# Patient Record
Sex: Female | Born: 2000 | Race: White | Hispanic: No | Smoking: Former smoker
Health system: Southern US, Community
[De-identification: ages and names within clinical notes are randomized; demographics above are authoritative.]

## PROBLEM LIST (undated history)

## (undated) DIAGNOSIS — B009 Herpesviral infection, unspecified: Secondary | ICD-10-CM

## (undated) DIAGNOSIS — K219 Gastro-esophageal reflux disease without esophagitis: Secondary | ICD-10-CM

## (undated) DIAGNOSIS — F32A Depression, unspecified: Secondary | ICD-10-CM

## (undated) DIAGNOSIS — E039 Hypothyroidism, unspecified: Secondary | ICD-10-CM

## (undated) HISTORY — DX: Herpesviral infection, unspecified: B00.9

## (undated) HISTORY — PX: WISDOM TOOTH EXTRACTION: SHX21

---
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
## 2019-01-14 ENCOUNTER — Other Ambulatory Visit: Payer: Self-pay

## 2019-11-04 ENCOUNTER — Other Ambulatory Visit: Payer: Self-pay | Admitting: Otolaryngology

## 2019-11-14 ENCOUNTER — Encounter (HOSPITAL_BASED_OUTPATIENT_CLINIC_OR_DEPARTMENT_OTHER): Payer: Self-pay | Admitting: Otolaryngology

## 2019-11-14 ENCOUNTER — Other Ambulatory Visit: Payer: Self-pay

## 2019-11-20 ENCOUNTER — Other Ambulatory Visit (HOSPITAL_COMMUNITY)
Admission: RE | Admit: 2019-11-20 | Discharge: 2019-11-20 | Disposition: A | Discharge status: Home / Self Care | Payer: 59 | Source: Ambulatory Visit | Attending: Otolaryngology | Admitting: Otolaryngology

## 2019-11-20 DIAGNOSIS — Z01812 Encounter for preprocedural laboratory examination: Secondary | ICD-10-CM | POA: Diagnosis not present

## 2019-11-20 DIAGNOSIS — Z20822 Contact with and (suspected) exposure to covid-19: Secondary | ICD-10-CM | POA: Insufficient documentation

## 2019-11-20 LAB — SARS CORONAVIRUS 2 (TAT 6-24 HRS): SARS Coronavirus 2: NEGATIVE

## 2019-11-24 ENCOUNTER — Ambulatory Visit (HOSPITAL_BASED_OUTPATIENT_CLINIC_OR_DEPARTMENT_OTHER): Payer: 59 | Admitting: Anesthesiology

## 2019-11-24 ENCOUNTER — Encounter (HOSPITAL_BASED_OUTPATIENT_CLINIC_OR_DEPARTMENT_OTHER)
Admission: RE | Disposition: A | Discharge status: Home / Self Care | Payer: Self-pay | Source: Home / Self Care | Attending: Otolaryngology

## 2019-11-24 ENCOUNTER — Encounter (HOSPITAL_BASED_OUTPATIENT_CLINIC_OR_DEPARTMENT_OTHER): Payer: Self-pay | Admitting: Otolaryngology

## 2019-11-24 ENCOUNTER — Other Ambulatory Visit: Payer: Self-pay

## 2019-11-24 ENCOUNTER — Ambulatory Visit (HOSPITAL_BASED_OUTPATIENT_CLINIC_OR_DEPARTMENT_OTHER)
Admission: RE | Admit: 2019-11-24 | Discharge: 2019-11-24 | Disposition: A | Discharge status: Home / Self Care | Payer: 59 | Attending: Otolaryngology | Admitting: Otolaryngology

## 2019-11-24 DIAGNOSIS — J3501 Chronic tonsillitis: Secondary | ICD-10-CM | POA: Diagnosis present

## 2019-11-24 DIAGNOSIS — F17290 Nicotine dependence, other tobacco product, uncomplicated: Secondary | ICD-10-CM | POA: Insufficient documentation

## 2019-11-24 HISTORY — DX: Hypothyroidism, unspecified: E03.9

## 2019-11-24 HISTORY — PX: TONSILLECTOMY: SHX5217

## 2019-11-24 HISTORY — DX: Gastro-esophageal reflux disease without esophagitis: K21.9

## 2019-11-24 HISTORY — DX: Depression, unspecified: F32.A

## 2019-11-24 LAB — POCT PREGNANCY, URINE: Preg Test, Ur: NEGATIVE

## 2019-11-24 SURGERY — TONSILLECTOMY
Anesthesia: General | Site: Throat | Laterality: Bilateral

## 2019-11-24 MED ORDER — LIDOCAINE 2% (20 MG/ML) 5 ML SYRINGE
INTRAMUSCULAR | Status: AC
  Filled 2019-11-24: qty 5

## 2019-11-24 MED ORDER — BACITRACIN ZINC 500 UNIT/GM EX OINT
TOPICAL_OINTMENT | CUTANEOUS | Status: AC
  Filled 2019-11-24: qty 0.9

## 2019-11-24 MED ORDER — FENTANYL CITRATE (PF) 100 MCG/2ML IJ SOLN
INTRAMUSCULAR | Status: AC
Start: 2019-11-24 — End: ?
  Filled 2019-11-24: qty 2

## 2019-11-24 MED ORDER — OXYCODONE HCL 5 MG PO TABS
5.0000 mg | ORAL_TABLET | Freq: Once | ORAL | Status: AC
  Administered 2019-11-24: 5 mg via ORAL

## 2019-11-24 MED ORDER — ONDANSETRON HCL 4 MG/2ML IJ SOLN
INTRAMUSCULAR | Status: AC
  Filled 2019-11-24: qty 2

## 2019-11-24 MED ORDER — SUGAMMADEX SODIUM 200 MG/2ML IV SOLN
INTRAVENOUS | Status: DC | PRN
  Administered 2019-11-24: 200 mg via INTRAVENOUS

## 2019-11-24 MED ORDER — ACETAMINOPHEN 500 MG PO TABS
1000.0000 mg | ORAL_TABLET | Freq: Once | ORAL | Status: AC
  Administered 2019-11-24: 1000 mg via ORAL

## 2019-11-24 MED ORDER — OXYMETAZOLINE HCL 0.05 % NA SOLN
NASAL | Status: DC | PRN
  Administered 2019-11-24: 1 via TOPICAL

## 2019-11-24 MED ORDER — ROCURONIUM BROMIDE 100 MG/10ML IV SOLN
INTRAVENOUS | Status: DC | PRN
  Administered 2019-11-24: 40 mg via INTRAVENOUS

## 2019-11-24 MED ORDER — MIDAZOLAM HCL 5 MG/5ML IJ SOLN
INTRAMUSCULAR | Status: DC | PRN
  Administered 2019-11-24: 2 mg via INTRAVENOUS

## 2019-11-24 MED ORDER — DEXAMETHASONE SODIUM PHOSPHATE 10 MG/ML IJ SOLN
INTRAMUSCULAR | Status: AC
  Filled 2019-11-24: qty 1

## 2019-11-24 MED ORDER — AZITHROMYCIN 200 MG/5ML PO SUSR: 500.0000 mg | Freq: Every day | ORAL | 0 refills | Status: AC

## 2019-11-24 MED ORDER — SODIUM CHLORIDE 0.9 % IR SOLN
Status: DC | PRN
  Administered 2019-11-24: 300 mL

## 2019-11-24 MED ORDER — OXYCODONE HCL 5 MG PO TABS
ORAL_TABLET | ORAL | Status: AC
  Filled 2019-11-24: qty 1

## 2019-11-24 MED ORDER — MIDAZOLAM HCL 2 MG/2ML IJ SOLN
INTRAMUSCULAR | Status: AC
  Filled 2019-11-24: qty 2

## 2019-11-24 MED ORDER — PROPOFOL 10 MG/ML IV BOLUS
INTRAVENOUS | Status: AC
  Filled 2019-11-24: qty 20

## 2019-11-24 MED ORDER — AMISULPRIDE (ANTIEMETIC) 5 MG/2ML IV SOLN: 10.0000 mg | Freq: Once | INTRAVENOUS | Status: DC | PRN

## 2019-11-24 MED ORDER — PROPOFOL 10 MG/ML IV BOLUS
INTRAVENOUS | Status: DC | PRN
  Administered 2019-11-24: 150 mg via INTRAVENOUS

## 2019-11-24 MED ORDER — FENTANYL CITRATE (PF) 100 MCG/2ML IJ SOLN
INTRAMUSCULAR | Status: AC
  Filled 2019-11-24: qty 2

## 2019-11-24 MED ORDER — LIDOCAINE HCL (CARDIAC) PF 100 MG/5ML IV SOSY
PREFILLED_SYRINGE | INTRAVENOUS | Status: DC | PRN
  Administered 2019-11-24: 60 mg via INTRAVENOUS

## 2019-11-24 MED ORDER — ROCURONIUM BROMIDE 10 MG/ML (PF) SYRINGE
PREFILLED_SYRINGE | INTRAVENOUS | Status: AC
  Filled 2019-11-24: qty 10

## 2019-11-24 MED ORDER — OXYMETAZOLINE HCL 0.05 % NA SOLN
NASAL | Status: AC
  Filled 2019-11-24: qty 30

## 2019-11-24 MED ORDER — ACETAMINOPHEN 500 MG PO TABS
ORAL_TABLET | ORAL | Status: AC
  Filled 2019-11-24: qty 2

## 2019-11-24 MED ORDER — FENTANYL CITRATE (PF) 100 MCG/2ML IJ SOLN
25.0000 ug | INTRAMUSCULAR | Status: DC | PRN
  Administered 2019-11-24: 50 ug via INTRAVENOUS

## 2019-11-24 MED ORDER — FENTANYL CITRATE (PF) 100 MCG/2ML IJ SOLN
INTRAMUSCULAR | Status: DC | PRN
  Administered 2019-11-24: 100 ug via INTRAVENOUS

## 2019-11-24 MED ORDER — ONDANSETRON HCL 4 MG/2ML IJ SOLN
INTRAMUSCULAR | Status: DC | PRN
  Administered 2019-11-24: 4 mg via INTRAVENOUS

## 2019-11-24 MED ORDER — LACTATED RINGERS IV SOLN: INTRAVENOUS | Status: DC

## 2019-11-24 MED ORDER — DEXAMETHASONE SODIUM PHOSPHATE 4 MG/ML IJ SOLN
INTRAMUSCULAR | Status: DC | PRN
  Administered 2019-11-24: 10 mg via INTRAVENOUS

## 2019-11-24 MED ORDER — OXYCODONE-ACETAMINOPHEN 5-325 MG PO TABS
1.0000 | ORAL_TABLET | ORAL | 0 refills | Status: AC | PRN
Start: 2019-11-24 — End: 2019-11-29

## 2019-11-24 SURGICAL SUPPLY — 33 items
BNDG COHESIVE 2X5 TAN STRL LF (GAUZE/BANDAGES/DRESSINGS) IMPLANT
CANISTER SUCT 1200ML W/VALVE (MISCELLANEOUS) ×4 IMPLANT
CATH ROBINSON RED A/P 10FR (CATHETERS) IMPLANT
CATH ROBINSON RED A/P 14FR (CATHETERS) ×3 IMPLANT
COAGULATOR SUCT 6 FR SWTCH (ELECTROSURGICAL)
COAGULATOR SUCT SWTCH 10FR 6 (ELECTROSURGICAL) IMPLANT
COVER BACK TABLE 60X90IN (DRAPES) ×4 IMPLANT
COVER MAYO STAND STRL (DRAPES) ×4 IMPLANT
COVER WAND RF STERILE (DRAPES) IMPLANT
ELECT REM PT RETURN 9FT ADLT (ELECTROSURGICAL) ×4
ELECT REM PT RETURN 9FT PED (ELECTROSURGICAL)
ELECTRODE REM PT RETRN 9FT PED (ELECTROSURGICAL) IMPLANT
ELECTRODE REM PT RTRN 9FT ADLT (ELECTROSURGICAL) ×1 IMPLANT
GAUZE SPONGE 4X4 12PLY STRL LF (GAUZE/BANDAGES/DRESSINGS) ×4 IMPLANT
GLOVE BIO SURGEON STRL SZ7 (GLOVE) ×3 IMPLANT
GLOVE BIO SURGEON STRL SZ7.5 (GLOVE) ×4 IMPLANT
GLOVE ECLIPSE 6.5 STRL STRAW (GLOVE) ×3 IMPLANT
GOWN STRL REUS W/ TWL LRG LVL3 (GOWN DISPOSABLE) ×4 IMPLANT
GOWN STRL REUS W/TWL LRG LVL3 (GOWN DISPOSABLE) ×8
IV NS 500ML (IV SOLUTION) ×4
IV NS 500ML BAXH (IV SOLUTION) ×2 IMPLANT
MARKER SKIN DUAL TIP RULER LAB (MISCELLANEOUS) IMPLANT
NS IRRIG 1000ML POUR BTL (IV SOLUTION) ×4 IMPLANT
SHEET MEDIUM DRAPE 40X70 STRL (DRAPES) ×4 IMPLANT
SOLUTION BUTLER CLEAR DIP (MISCELLANEOUS) ×4 IMPLANT
SPONGE TONSIL TAPE 1.25 RFD (DISPOSABLE) ×4 IMPLANT
SYR BULB EAR ULCER 3OZ GRN STR (SYRINGE) IMPLANT
TOWEL GREEN STERILE FF (TOWEL DISPOSABLE) ×4 IMPLANT
TUBE CONNECTING 20'X1/4 (TUBING) ×1
TUBE CONNECTING 20X1/4 (TUBING) ×3 IMPLANT
TUBE SALEM SUMP 12R W/ARV (TUBING) IMPLANT
TUBE SALEM SUMP 16 FR W/ARV (TUBING) ×3 IMPLANT
WAND COBLATOR 70 EVAC XTRA (SURGICAL WAND) ×4 IMPLANT

## 2019-11-24 NOTE — Anesthesia Procedure Notes (Signed)
Procedure Name: Intubation Date/Time: 11/24/2019 7:40 AM Performed by: Thornell Mule, CRNA Pre-anesthesia Checklist: Patient identified, Emergency Drugs available, Suction available and Patient being monitored Patient Re-evaluated:Patient Re-evaluated prior to induction Oxygen Delivery Method: Circle system utilized Preoxygenation: Pre-oxygenation with 100% oxygen Induction Type: IV induction Ventilation: Mask ventilation without difficulty Laryngoscope Size: Miller and 3 Grade View: Grade I Tube type: Oral Tube size: 7.0 mm Number of attempts: 1 Airway Equipment and Method: Stylet and Oral airway Placement Confirmation: ETT inserted through vocal cords under direct vision,  positive ETCO2 and breath sounds checked- equal and bilateral Secured at: 21 cm Tube secured with: Tape Dental Injury: Teeth and Oropharynx as per pre-operative assessment

## 2019-11-24 NOTE — Discharge Instructions (Addendum)
[facility] Postoperative Instructions for Tonsillectomy  Activity Restrict activity at home for the first two days, resting as much as possible. Light indoor activity is best. You may usually return to school or work within a week but void strenuous activity and sports for two weeks. Sleep with your head elevated on 2-3 pillows for 3-4 days to help decrease swelling. Diet Due to tissue swelling and throat discomfort, you may have little desire to drink for several days. However fluids are very important to prevent dehydration. You will find that non-acidic juices, soups, popsicles, Jell-O, custard, puddings, and any soft or mashed foods taken in small quantities can be swallowed fairly easily. Try to increase your fluid and food intake as the discomfort subsides. It is recommended that a child receive 1-1/2 quarts of fluid in a 24-hour period. Adult require twice this amount.  Discomfort Your sore throat may be relieved by applying an ice collar to your neck and/or by taking Tylenol. You may experience an earache, which is due to referred pain from the throat. Referred ear pain is commonly felt at night when trying to rest.   Post Anesthesia Home Care Instructions  Activity: Get plenty of rest for the remainder of the day. A responsible individual must stay with you for 24 hours following the procedure.  For the next 24 hours, DO NOT: -Drive a car -Advertising copywriter -Drink alcoholic beverages -Take any medication unless instructed by your physician -Make any legal decisions or sign important papers.  Meals: Start with liquid foods such as gelatin or soup. Progress to regular foods as tolerated. Avoid greasy, spicy, heavy foods. If nausea and/or vomiting occur, drink only clear liquids until the nausea and/or vomiting subsides. Call your physician if vomiting continues.  Special Instructions/Symptoms: Your throat may feel dry or sore from the anesthesia or the breathing tube  placed in your throat during surgery. If this causes discomfort, gargle with warm salt water. The discomfort should disappear within 24 hours.  If you had a scopolamine patch placed behind your ear for the management of post- operative nausea and/or vomiting:  1. The medication in the patch is effective for 72 hours, after which it should be removed.  Wrap patch in a tissue and discard in the trash. Wash hands thoroughly with soap and water. 2. You may remove the patch earlier than 72 hours if you experience unpleasant side effects which may include dry mouth, dizziness or visual disturbances. 3. Avoid touching the patch. Wash your hands with soap and water after contact with the patch.    Do not take Tylenol until 1:30 pm.  Bleeding                        Although rare, there is risk of having some bleeding during the first 2 weeks after having a T&A. This usually happens between days 7-10 postoperatively. If you or your child should have any bleeding, try to remain calm. We recommend sitting up quietly in a chair and gently spitting out the blood into a bowl. For adults, gargling gently with ice water may help. If the bleeding does not stop after a short time (5 minutes), is more than 1 teaspoonful, or if you become worried, please call our office at [phone] or go directly to the nearest hospital emergency room. Do not eat or drink anything prior to going to the hospital as you may need to be taken to the operating room  in order to control the bleeding. GENERAL CONSIDERATIONS 1. Brush your teeth regularly. Avoid mouthwashes and gargles for three weeks. You may gargle gently with warm salt-water as necessary or spray with Chloraseptic. You may make salt-water by placing 2 teaspoons of table salt into a quart of fresh water. Warm the salt-water in a microwave to a luke warm temperature.  2. Avoid exposure to colds and upper respiratory infections if possible.  3. If you look into a mirror or  into your child's mouth, you will see white-gray patches in the back of the throat. This is normal after having a T&A and is like a scab that forms on the skin after an abrasion. It will disappear once the back of the throat heals completely. However, it may cause a noticeable odor; this too will disappear with time. Again, warm salt-water gargles may be used to help keep the throat clean and promote healing.  4. You may notice a temporary change in voice quality, such as a higher pitched voice or a nasal sound, until healing is complete. This may last for 1-2 weeks and should resolve.  5. Do not take or give you child any medications that we have not prescribed or recommended.  6. Snoring may occur, especially at night, for the first week after a T&A. It is due to swelling of the soft palate and will usually resolve.  Please call our office at [phone] if you have any questions.

## 2019-11-24 NOTE — Anesthesia Preprocedure Evaluation (Addendum)
Anesthesia Evaluation  Patient identified by MRN, date of birth, ID band Patient awake    Reviewed: Allergy & Precautions, NPO status , Patient's Chart, lab work & pertinent test results  Airway Mallampati: II  TM Distance: >3 FB Neck ROM: Full    Dental  (+) Dental Advisory Given   Pulmonary Current Smoker,    breath sounds clear to auscultation       Cardiovascular negative cardio ROS   Rhythm:Regular Rate:Normal     Neuro/Psych negative neurological ROS     GI/Hepatic Neg liver ROS, GERD  ,  Endo/Other  Hypothyroidism   Renal/GU negative Renal ROS     Musculoskeletal   Abdominal   Peds  Hematology negative hematology ROS (+)   Anesthesia Other Findings   Reproductive/Obstetrics                             Anesthesia Physical Anesthesia Plan  ASA: II  Anesthesia Plan: General   Post-op Pain Management:    Induction: Intravenous  PONV Risk Score and Plan: 2 and Dexamethasone, Ondansetron and Treatment may vary due to age or medical condition  Airway Management Planned: Oral ETT  Additional Equipment:   Intra-op Plan:   Post-operative Plan: Extubation in OR  Informed Consent: I have reviewed the patients History and Physical, chart, labs and discussed the procedure including the risks, benefits and alternatives for the proposed anesthesia with the patient or authorized representative who has indicated his/her understanding and acceptance.     Dental advisory given  Plan Discussed with: CRNA  Anesthesia Plan Comments:         Anesthesia Quick Evaluation

## 2019-11-24 NOTE — H&P (Signed)
CC: Chronic sore throat, enlarged tonsils, frequent tonsil stone  HPI: The patient is a 19 year old female who presents today complaining of chronic sore throat, enlarged tonsils and frequent tonsil stone accumulations.  According to the patient, she has been symptomatic for many years.  Her tonsils have been chronically enlarged.  She also has a history of recurrent tonsillitis.  At one point, she had 6 strep infections in 1 year.  She also reports occasional snoring.  She is unsure if she has any sleep apnea.  The patient previously underwent bilateral myringotomy and tube placement and adenoidectomy as a child.  She has no other ENT surgery.   The patient's review of systems (constitutional, eyes, ENT, cardiovascular, respiratory, GI, musculoskeletal, skin, neurologic, psychiatric, endocrine, hematologic, allergic) is noted in the ROS questionnaire.  It is reviewed with the patient.   Family health history: Thyroid disease, diabetes .  Major events: Adenoidectomy, wisdom teeth excision, myringotomy with tubes.  Ongoing medical problems: Reflux, nausea, headaches, migraine, anxiety, thyroid disease.  Social history: The patient is single. She vapes on occasions. She rarely drinks alcohol. He denies the use of illegal drugs.   Exam: General: Communicates without difficulty, well nourished, no acute distress. Head: Normocephalic, no evidence injury, no tenderness, facial buttresses intact without stepoff. Face/sinus: No tenderness to palpation and percussion. Facial movement is normal and symmetric. Eyes: PERRL, EOMI. No scleral icterus, conjunctivae clear. Neuro: CN II exam reveals vision grossly intact.  No nystagmus at any point of gaze. Ears: Auricles well formed without lesions.  Ear canals are intact without mass or lesion.  No erythema or edema is appreciated.  The TMs are intact without fluid. Nose: External evaluation reveals normal support and skin without lesions.  Dorsum is intact.  Anterior  rhinoscopy reveals pink mucosa over anterior aspect of inferior turbinates and intact septum.  No purulence noted. Oral:  Oral cavity and oropharynx are intact, symmetric. 3+ cryptic tonsils bilaterally. Mucosa is moist without lesions. Neck: Full range of motion without pain.  There is no significant  lymphadenopathy.  No masses palpable.  Thyroid bed within normal limits to palpation.  Parotid glands and submandibular glands equal bilaterally without mass.  Trachea is midline. Neuro:  CN 2-12 grossly intact. Gait normal.  Assessment 1.  The patient's history and physical exam findings are consistent with chronic tonsillitis, secondary to tonsillar hypertrophy. She is noted to have 3+ tonsils bilaterally.  2.  No acute infection is noted today.   Plan  1.  The physical exam findings are reviewed with the patient.  2.  Treatment options are discussed.  The options include conservative observation, medical management, or surgical intervention with tonsillectomy/possible adenoidectomy.  The risks, benefits, alternatives and details of the treatment options are reviewed.   3.  The patient is interested in proceeding with surgical intervention.  We will schedule the procedure in accordance with the family's schedule.

## 2019-11-24 NOTE — Op Note (Signed)
DATE OF PROCEDURE:  11/24/2019                              OPERATIVE REPORT  SURGEON:  Newman Pies, MD  PREOPERATIVE DIAGNOSES: 1. Tonsillar hypertrophy. 2. Chronic tonsillitis.  POSTOPERATIVE DIAGNOSES: 1. Tonsillar hypertrophy. 2. Chronic tonsillitis.  PROCEDURE PERFORMED:  Tonsillectomy.  ANESTHESIA:  General endotracheal tube anesthesia.  COMPLICATIONS:  None.  ESTIMATED BLOOD LOSS:  Minimal.  INDICATION FOR PROCEDURE:  Melissa Ayers is a 19 y.o. female with a history of chronic tonsillitis and halitosis.  According to the patient, she has been experiencing chronic throat discomfort with halitosis for several years. The patient continued to be symptomatic despite medical treatments. On examination, the patient was noted to have bilateral cryptic tonsils, with numerous tonsilloliths. Based on the above findings, the decision was made for the patient to undergo the tonsillectomy procedure. Likelihood of success in reducing symptoms was also discussed.  The risks, benefits, alternatives, and details of the procedure were discussed with the patient.  Questions were invited and answered.  Informed consent was obtained.  DESCRIPTION:  The patient was taken to the operating room and placed supine on the operating table.  General endotracheal tube anesthesia was administered by the anesthesiologist.  The patient was positioned and prepped and draped in a standard fashion for adenotonsillectomy.  A Crowe-Davis mouth gag was inserted into the oral cavity for exposure. 3+ cryptic tonsils were noted bilaterally.  No bifidity was noted.  Indirect mirror examination of the nasopharynx revealed no adenoid regrowth. The right tonsil was then grasped with a straight Allis clamp and retracted medially.  It was resected free from the underlying pharyngeal constrictor muscles with the Coblator device.  The same procedure was repeated on the left side without exception.  The surgical sites were copiously  irrigated.  The mouth gag was removed.  The care of the patient was turned over to the anesthesiologist.  The patient was awakened from anesthesia without difficulty.  The patient was extubated and transferred to the recovery room in good condition.  OPERATIVE FINDINGS:  Tonsillar hypertrophy.  SPECIMEN:  None.  FOLLOWUP CARE:  The patient will be discharged home once awake and alert.  She will be placed on azithromycin for 3 days, and percocet for postop pain control.   The patient will follow up in my office in approximately 2 weeks.  Tharon Kitch W Tallyn Holroyd 11/24/2019 8:23 AM

## 2019-11-24 NOTE — Transfer of Care (Signed)
Immediate Anesthesia Transfer of Care Note  Patient: Melissa Ayers  Procedure(s) Performed: TONSILLECTOMY (Bilateral Throat)  Patient Location: PACU  Anesthesia Type:General  Level of Consciousness: drowsy, patient cooperative and responds to stimulation  Airway & Oxygen Therapy: Patient Spontanous Breathing and Patient connected to face mask oxygen  Post-op Assessment: Report given to RN and Post -op Vital signs reviewed and stable  Post vital signs: Reviewed and stable  Last Vitals:  Vitals Value Taken Time  BP    Temp    Pulse 93 11/24/19 0827  Resp 12 11/24/19 0827  SpO2 100 % 11/24/19 0827  Vitals shown include unvalidated device data.  Last Pain:  Vitals:   11/24/19 0653  TempSrc: Oral  PainSc: 0-No pain         Complications: No complications documented.

## 2019-11-24 NOTE — Anesthesia Postprocedure Evaluation (Signed)
Anesthesia Post Note  Patient: Melissa Ayers  Procedure(s) Performed: TONSILLECTOMY (Bilateral Throat)     Patient location during evaluation: PACU Anesthesia Type: General Level of consciousness: awake and alert Pain management: pain level controlled Vital Signs Assessment: post-procedure vital signs reviewed and stable Respiratory status: spontaneous breathing, nonlabored ventilation, respiratory function stable and patient connected to nasal cannula oxygen Cardiovascular status: blood pressure returned to baseline and stable Postop Assessment: no apparent nausea or vomiting Anesthetic complications: no   No complications documented.  Last Vitals:  Vitals:   11/24/19 0834 11/24/19 0845  BP: 127/78 124/82  Pulse: 82 84  Resp: 13 12  Temp:    SpO2: 100% 98%    Last Pain:  Vitals:   11/24/19 0845  TempSrc:   PainSc: 5                  Kennieth Rad

## 2019-11-25 ENCOUNTER — Encounter (HOSPITAL_BASED_OUTPATIENT_CLINIC_OR_DEPARTMENT_OTHER): Payer: Self-pay | Admitting: Otolaryngology

## 2020-09-14 ENCOUNTER — Other Ambulatory Visit: Payer: Self-pay

## 2020-09-14 ENCOUNTER — Inpatient Hospital Stay (HOSPITAL_COMMUNITY)
Admission: AD | Admit: 2020-09-14 | Discharge: 2020-09-14 | Disposition: A | Discharge status: Home / Self Care | Payer: 59 | Attending: Obstetrics & Gynecology | Admitting: Obstetrics & Gynecology

## 2020-09-14 ENCOUNTER — Inpatient Hospital Stay (HOSPITAL_COMMUNITY): Payer: 59

## 2020-09-14 ENCOUNTER — Encounter (HOSPITAL_COMMUNITY): Payer: Self-pay | Admitting: Obstetrics & Gynecology

## 2020-09-14 DIAGNOSIS — Z3A11 11 weeks gestation of pregnancy: Secondary | ICD-10-CM | POA: Insufficient documentation

## 2020-09-14 DIAGNOSIS — O0289 Other abnormal products of conception: Secondary | ICD-10-CM | POA: Insufficient documentation

## 2020-09-14 DIAGNOSIS — M545 Low back pain, unspecified: Secondary | ICD-10-CM | POA: Diagnosis not present

## 2020-09-14 DIAGNOSIS — O26891 Other specified pregnancy related conditions, first trimester: Secondary | ICD-10-CM | POA: Diagnosis present

## 2020-09-14 DIAGNOSIS — Z87891 Personal history of nicotine dependence: Secondary | ICD-10-CM | POA: Diagnosis not present

## 2020-09-14 DIAGNOSIS — O26851 Spotting complicating pregnancy, first trimester: Secondary | ICD-10-CM | POA: Diagnosis not present

## 2020-09-14 LAB — WET PREP, GENITAL
Clue Cells Wet Prep HPF POC: NONE SEEN
Sperm: NONE SEEN
Trich, Wet Prep: NONE SEEN

## 2020-09-14 LAB — CBC
HCT: 41.4 % (ref 36.0–46.0)
Hemoglobin: 13.8 g/dL (ref 12.0–15.0)
MCH: 30.3 pg (ref 26.0–34.0)
MCHC: 33.3 g/dL (ref 30.0–36.0)
MCV: 91 fL (ref 80.0–100.0)
Platelets: 272 10*3/uL (ref 150–400)
RBC: 4.55 MIL/uL (ref 3.87–5.11)
RDW: 12.2 % (ref 11.5–15.5)
WBC: 9.5 10*3/uL (ref 4.0–10.5)
nRBC: 0 % (ref 0.0–0.2)

## 2020-09-14 LAB — URINALYSIS, ROUTINE W REFLEX MICROSCOPIC
Bilirubin Urine: NEGATIVE
Glucose, UA: NEGATIVE mg/dL
Hgb urine dipstick: NEGATIVE
Ketones, ur: 5 mg/dL — AB
Leukocytes,Ua: NEGATIVE
Nitrite: NEGATIVE
Protein, ur: NEGATIVE mg/dL
Specific Gravity, Urine: 1.014 (ref 1.005–1.030)
pH: 6 (ref 5.0–8.0)

## 2020-09-14 LAB — HCG, QUANTITATIVE, PREGNANCY: hCG, Beta Chain, Quant, S: 31487 m[IU]/mL — ABNORMAL HIGH (ref <5)

## 2020-09-14 LAB — POCT PREGNANCY, URINE: Preg Test, Ur: POSITIVE — AB

## 2020-09-14 LAB — ABO/RH: ABO/RH(D): O POS

## 2020-09-14 MED ORDER — MISOPROSTOL 200 MCG PO TABS: ORAL_TABLET | ORAL | 0 refills | Status: DC

## 2020-09-14 MED ORDER — IBUPROFEN 600 MG PO TABS: 600.0000 mg | ORAL_TABLET | Freq: Four times a day (QID) | ORAL | 0 refills | Status: DC | PRN

## 2020-09-14 MED ORDER — HYDROCODONE-ACETAMINOPHEN 5-325 MG PO TABS: 2.0000 | ORAL_TABLET | ORAL | 0 refills | Status: AC | PRN

## 2020-09-14 NOTE — MAU Note (Signed)
...  Melissa Ayers is a 20 y.o. at approximately [redacted]w[redacted]d here in MAU reporting: Vaginal spotting that began after intercourse yesterday morning. Patient states her spotting has increased some today and pink/light red tinged when she wipes. Denies abdominal pain but endorses lower back pain that she rates a 4/10. Denies any abnormal discharge, states her discharge prior to the spotting was clear/white and thin.  LMP:  06/25/2020 Lab orders placed from triage:  POCT Pregnancy Urine and UA

## 2020-09-14 NOTE — MAU Provider Note (Signed)
History     CSN: [id number]  Arrival date and time: 09/14/20 1225  Event Date/Time  First Provider Initiated Contact with Patient 09/14/20 1553      Chief Complaint  Patient presents with   Back Pain   Vaginal Bleeding   HPI Melissa Ayers is a 20 y.o. G1P0 at 3513w5d by certain LMP. She presents to MAU with chief complaint of low back pain and vaginal spotting. These are new problems. Patient's report of onset varied from one day (as reported to triage RN) to two weeks ago (as reported to CNM) during her evaluation in MAU. Spotting is very light and began after intercourse. She denies heavy vaginal bleeding. Back pain is generalized to her lower back. Pain score is 4/10. She has not taken medication or tried other treatments for this complaint. She denies abdominal pain, abdominal tenderness, weakness, fever, syncope.  OB History     Gravida  1   Para      Term      Preterm      AB      Living         SAB      IAB      Ectopic      Multiple      Live Births              Past Medical History:  Diagnosis Date   Depression    GERD (gastroesophageal reflux disease)    Hypothyroidism     Past Surgical History:  Procedure Laterality Date   TONSILLECTOMY Bilateral 11/24/2019   Procedure: TONSILLECTOMY;  Surgeon: Newman Pieseoh, Su, MD;  Location: [facility];  Service: ENT;  Laterality: Bilateral;   WISDOM TOOTH EXTRACTION      History reviewed. No pertinent family history.  Social History   Tobacco Use   Smoking status: Former    Packs/day: 0.25    Types: Cigarettes   Smokeless tobacco: Never  Vaping Use   Vaping Use: Former  Substance Use Topics   Alcohol use: Never   Drug use: Not Currently    Types: Marijuana    Comment: patient states three weeks ago as of 09/14/2020    Allergies:  Allergies  Allergen Reactions   Penicillins Rash    Medications Prior to Admission  Medication Sig Dispense Refill Last Dose   levothyroxine  (SYNTHROID) 25 MCG tablet Take 25 mcg by mouth daily before breakfast.   09/14/2020   valACYclovir (VALTREX) 500 MG tablet Take 500 mg by mouth 2 (two) times daily.   09/13/2020    Review of Systems  Genitourinary:  Positive for vaginal bleeding.  Musculoskeletal:  Positive for back pain.  All other systems reviewed and are negative. Physical Exam   Blood pressure 135/73, pulse 92, temperature 98.3 F (36.8 C), temperature source Oral, resp. rate 16, height 5\' 6"  (1.676 m), weight 82.1 kg, last menstrual period 06/25/2020, SpO2 100 %.  Physical Exam Vitals and nursing note reviewed. Exam conducted with a chaperone present.  Constitutional:      Appearance: Normal appearance.  Cardiovascular:     Rate and Rhythm: Normal rate and regular rhythm.     Pulses: Normal pulses.     Heart sounds: Normal heart sounds.  Pulmonary:     Effort: Pulmonary effort is normal.     Breath sounds: Normal breath sounds.  Abdominal:     General: Abdomen is flat.  Skin:    Capillary Refill: Capillary refill takes less than 2  seconds.  Neurological:     Mental Status: She is alert and oriented to person, place, and time.  Psychiatric:        Mood and Affect: Mood normal.        Behavior: Behavior normal.        Thought Content: Thought content normal.        Judgment: Judgment normal.    MAU Course  Procedures  Patient Vitals for the past 24 hrs:  BP Temp Temp src Pulse Resp SpO2 Height Weight  09/14/20 1619 121/70 -- -- 91 17 -- -- --  09/14/20 1304 135/73 -- -- 92 -- -- -- --  09/14/20 1249 129/70 98.3 F (36.8 C) Oral 92 16 100 % 5\' 6"  (1.676 m) 82.1 kg   Orders Placed This Encounter  Procedures   Wet prep, genital   OB LESS THAN 14 WEEKS WITH OB TRANSVAGINAL   Urinalysis, Routine w reflex microscopic Urine, Clean Catch   CBC   hCG, quantitative, pregnancy   Nursing communication   Pregnancy, urine POC   ABO/Rh   Discharge patient   Results for orders placed or performed during  the hospital encounter of 09/14/20 (from the past 24 hour(s))  Pregnancy, urine POC     Status: Abnormal   Collection Time: 09/14/20 12:45 PM  Result Value Ref Range   Preg Test, Ur POSITIVE (A) NEGATIVE  Urinalysis, Routine w reflex microscopic Urine, Clean Catch     Status: Abnormal   Collection Time: 09/14/20 12:47 PM  Result Value Ref Range   Color, Urine YELLOW YELLOW   APPearance CLEAR CLEAR   Specific Gravity, Urine 1.014 1.005 - 1.030   pH 6.0 5.0 - 8.0   Glucose, UA NEGATIVE NEGATIVE mg/dL   Hgb urine dipstick NEGATIVE NEGATIVE   Bilirubin Urine NEGATIVE NEGATIVE   Ketones, ur 5 (A) NEGATIVE mg/dL   Protein, ur NEGATIVE NEGATIVE mg/dL   Nitrite NEGATIVE NEGATIVE   Leukocytes,Ua NEGATIVE NEGATIVE  CBC     Status: None   Collection Time: 09/14/20  1:27 PM  Result Value Ref Range   WBC 9.5 4.0 - 10.5 K/uL   RBC 4.55 3.87 - 5.11 MIL/uL   Hemoglobin 13.8 12.0 - 15.0 g/dL   HCT 09/16/20 07.1 - 21.9 %   MCV 91.0 80.0 - 100.0 fL   MCH 30.3 26.0 - 34.0 pg   MCHC 33.3 30.0 - 36.0 g/dL   RDW 75.8 83.2 - 54.9 %   Platelets 272 150 - 400 K/uL   nRBC 0.0 0.0 - 0.2 %  hCG, quantitative, pregnancy     Status: Abnormal   Collection Time: 09/14/20  1:27 PM  Result Value Ref Range   hCG, Beta Chain, Quant, S 31,487 (H) <5 mIU/mL  Wet prep, genital     Status: Abnormal   Collection Time: 09/14/20  1:30 PM   Specimen: Vaginal  Result Value Ref Range   Yeast Wet Prep HPF POC PRESENT (A) NONE SEEN   Trich, Wet Prep NONE SEEN NONE SEEN   Clue Cells Wet Prep HPF POC NONE SEEN NONE SEEN   WBC, Wet Prep HPF POC FEW (A) NONE SEEN   Sperm NONE SEEN   ABO/Rh     Status: None   Collection Time: 09/14/20  1:32 PM  Result Value Ref Range   ABO/RH(D) O POS    No rh immune globuloin      NOT A RH IMMUNE GLOBULIN CANDIDATE, PT RH POSITIVE Performed at [facility]  [facility], [address] [place], [place] [address]    US OB LESS THAN 14 WEEKS WITH OB TRANSVAGINAL  Result Date:  09/14/2020 CLINICAL DATA:  Spotting in first trimester of pregnancy, LMP 06/25/2020 EXAM: OBSTETRIC <14 WK [place] AND TRANSVAGINAL OB US TECHNIQUE: Both transabdominal and transvaginal ultrasound examinations were performed for complete evaluation of the gestation as well as the maternal uterus, adnexal regions, and pelvic cul-de-sac. Transvaginal technique was performed to assess early pregnancy. COMPARISON:  None FINDINGS: Intrauterine gestational sac: Present, single, minimally irregular; thin membrane seen within sac Yolk sac:  Not identified Embryo:  Not identified Cardiac Activity: N/A Heart Rate: N/A  bpm MSD: 28.8 mm   7 w   6 d CRL:    mm    w    d                  [place] EDC: Subchorionic hemorrhage:  None identified Maternal uterus/adnexae: RIGHT ovary normal size and morphology, 3.1 x 1.3 x 1.5 cm. LEFT ovary normal size and morphology, 3.8 x 1.8 x 2.2 cm. No free pelvic fluid or adnexal masses. IMPRESSION: Gestational sac is seen within the uterus, containing a thin membrane but no definite fetal pole or yolk sac are identified. Based on mean sac diameter of 28.8 mm, findings meet definitive criteria for failed pregnancy. This follows SRU consensus guidelines: Diagnostic Criteria for Nonviable Pregnancy Early in the First Trimester. [facility] [phone]. Electronically Signed   By: Ulyses Southward M.D.   On: 09/14/2020 15:48    Early Intrauterine Pregnancy Failure  X  Documented intrauterine pregnancy failure less than or equal to [redacted] weeks gestation  X  No serious current illness  X  Baseline Hgb greater than or equal to 10g/dl  X  Patient has easily accessible transportation to the hospital  X  Clear preference  X  Practitioner/physician deems patient reliable  X  Counseling by practitioner or physician  N/A Rho-Gam given by RN if indicated  X  Medication dispensed:  -->Cytotec 800 mcg (buccally by patient at home)    -->Ibuprofen 600 mg 1 tablet by mouth every 6 hours as needed  #30  -->Hydrocodone/acetaminophen 5/325 mg by mouth every 4 to 6 hours as needed  Meds ordered this encounter  Medications   misoprostol (CYTOTEC) 200 MCG tablet    Sig: Place four tablets in between your gums and cheeks (two tablets on each side) as instructed    Dispense:  4 tablet    Refill:  0    Order Specific Question:   Supervising Provider    Answer:   Myna Hidalgo [[id number]]   ibuprofen (ADVIL) 600 MG tablet    Sig: Take 1 tablet (600 mg total) by mouth every 6 (six) hours as needed for up to 30 doses.    Dispense:  30 tablet    Refill:  0    Order Specific Question:   Supervising Provider    Answer:   Myna Hidalgo [[id number]]   HYDROcodone-acetaminophen (NORCO/VICODIN) 5-325 MG tablet    Sig: Take 2 tablets by mouth every 4 (four) hours as needed for up to 2 days for severe pain.    Dispense:  24 tablet    Refill:  0    Order Specific Question:   Supervising Provider    Answer:   Myna Hidalgo [[id number]]   Assessment and Plan  --20 y.o. G1P0 with nonviable pregnancy --Cytotec to be administered at home per patient's stated preference --  Blood type O POS --+ yeast, patient to treat with OTC Monistat PRN after she has been seen for miscarriage followup --Extensive teaching at bedside with CNM, teachback completed --Discharge home in stable condition  F/U: --Patient scheduled for New Gyn at [facility] on 09/13 --Message sent asking to convert appointment to miscarriage followup in one week  Calvert Cantor, MSA, MSN, CNM

## 2020-09-15 LAB — GC/CHLAMYDIA PROBE AMP (~~LOC~~) NOT AT ARMC
Chlamydia: NEGATIVE
Comment: NEGATIVE
Comment: NORMAL
Neisseria Gonorrhea: NEGATIVE

## 2020-09-23 ENCOUNTER — Other Ambulatory Visit: Payer: Self-pay

## 2020-09-23 ENCOUNTER — Encounter: Payer: Self-pay | Admitting: Women's Health

## 2020-09-23 ENCOUNTER — Ambulatory Visit (INDEPENDENT_AMBULATORY_CARE_PROVIDER_SITE_OTHER): Payer: Medicaid Other | Admitting: Women's Health

## 2020-09-23 VITALS — BP 130/78 | HR 96 | Ht 66.0 in | Wt 176.0 lb

## 2020-09-23 DIAGNOSIS — O039 Complete or unspecified spontaneous abortion without complication: Secondary | ICD-10-CM

## 2020-09-23 NOTE — Progress Notes (Signed)
   GYN VISIT Patient name: Melissa Ayers MRN [id number]  Date of birth: December 31, 2000 Chief Complaint:   new gyn (Missed AB)  History of Present Illness:   Melissa Ayers is a 20 y.o. G1P0 Caucasian female being seen today for f/u missed ab.  Seen in MAU 09/14/20.  HCG 31,487, U/S no YS or embryo, GS c/w [redacted]w[redacted]d, normal ovaries bilaterally. Rx'd cytotec. Rh+. States she took cytotec same day, had bleeding/clots and increased cramping 1-2d after taking cytotec, not sure if she saw a sac, but then bleeding/cramping improved and both have almost completely gone away. Sex 1-2 d ago, wants me to do exam to make sure everything's ok. Does want to try for another pregnancy. Stopped pnv.  Patient's last menstrual period was 06/25/2020. The current method of family planning is none.  Last pap 2022 at [facility]. Results were:  neg per pt  Depression screen [facility] 2/9 09/23/2020  Decreased Interest 0  Down, Depressed, Hopeless 0  PHQ - 2 Score 0  Altered sleeping 0  Tired, decreased energy 0  Change in appetite 0  Feeling bad or failure about yourself  0  Trouble concentrating 0  Moving slowly or fidgety/restless 0  Suicidal thoughts 0  PHQ-9 Score 0     GAD 7 : Generalized Anxiety Score 09/23/2020  Nervous, Anxious, on Edge 0  Control/stop worrying 0  Worry too much - different things 0  Trouble relaxing 0  Restless 0  Easily annoyed or irritable 0     Review of Systems:   Pertinent items are noted in HPI Denies fever/chills, dizziness, headaches, visual disturbances, fatigue, shortness of breath, chest pain, abdominal pain, vomiting, abnormal vaginal discharge/itching/odor/irritation, problems with periods, bowel movements, urination, or intercourse unless otherwise stated above.  Pertinent History Reviewed:  Reviewed past medical,surgical, social, obstetrical and family history.  Reviewed problem list, medications and allergies. Physical Assessment:   Vitals:   09/23/20 1412  BP: 130/78  Pulse:  96  Weight: 176 lb (79.8 kg)  Height: 5\' 6"  (1.676 m)  Body mass index is 28.41 kg/m.       Physical Examination:   General appearance: alert, well appearing, and in no distress  Mental status: alert, oriented to person, place, and time  Skin: warm & dry   Cardiovascular: normal heart rate noted  Respiratory: normal respiratory effort, no distress  Abdomen: soft, non-tender   Pelvic: VULVA: normal appearing vulva with no masses, tenderness or lesions, VAGINA: normal appearing vagina with normal color and discharge, no lesions, CERVIX: normal appearing cervix without discharge or lesions  Extremities: no edema   Chaperone: Peggy Dones    No results found for this or any previous visit (from the past 24 hour(s)).  Assessment & Plan:  1) Recent miscarriage> sounds like completed SAB, will check hcg and follow back to pre-pregnancy levels, then ok to start trying again for another pregnancy. Resume pnv. Rh+  Meds: No orders of the defined types were placed in this encounter.   Orders Placed This Encounter  Procedures   Beta hCG quant (ref lab)    Return for get pap from [facility] please; will discuss f/u after lab results.  [facility] CNM, [facility] 09/23/2020 2:56 PM

## 2020-09-24 LAB — BETA HCG QUANT (REF LAB): hCG Quant: 126 m[IU]/mL

## 2020-09-24 NOTE — Addendum Note (Signed)
Addended by: Cheral Marker on: 09/24/2020 02:46 PM   Modules accepted: Orders

## 2020-09-28 ENCOUNTER — Telehealth: Payer: Self-pay

## 2020-09-28 NOTE — Telephone Encounter (Signed)
Called pt, used two identifiers, read pt the msg Melissa Ayers left on Mychart. Pt confirmed understanding. Asked question, but service cut out and lost call.

## 2020-09-28 NOTE — Telephone Encounter (Signed)
-----   Message from Cheral Marker, [place] sent at 09/28/2020  3:52 PM EDT ----- Hasn't read [employer]. Please call and read it to her/have her read it. Thanks

## 2020-10-05 ENCOUNTER — Encounter: Payer: PRIVATE HEALTH INSURANCE | Admitting: Adult Health

## 2020-10-25 ENCOUNTER — Emergency Department (HOSPITAL_COMMUNITY): Admission: EM | Admit: 2020-10-25 | Discharge: 2020-10-25 | Discharge status: Against Medical Advice | Payer: 59

## 2020-10-25 ENCOUNTER — Other Ambulatory Visit: Payer: Self-pay

## 2020-10-25 NOTE — ED Triage Notes (Signed)
Screener reported to triage NT that pt has left prior to triage.

## 2020-11-29 ENCOUNTER — Encounter: Payer: Self-pay | Admitting: Women's Health

## 2020-11-29 ENCOUNTER — Other Ambulatory Visit: Payer: Self-pay

## 2020-11-29 ENCOUNTER — Ambulatory Visit (INDEPENDENT_AMBULATORY_CARE_PROVIDER_SITE_OTHER): Payer: Medicaid Other | Admitting: Women's Health

## 2020-11-29 ENCOUNTER — Other Ambulatory Visit (HOSPITAL_COMMUNITY)
Admission: RE | Admit: 2020-11-29 | Discharge: 2020-11-29 | Disposition: A | Discharge status: Home / Self Care | Payer: Medicaid Other | Source: Ambulatory Visit | Attending: Women's Health | Admitting: Women's Health

## 2020-11-29 VITALS — BP 126/76 | HR 87 | Ht 66.0 in | Wt 186.6 lb

## 2020-11-29 DIAGNOSIS — Z01419 Encounter for gynecological examination (general) (routine) without abnormal findings: Secondary | ICD-10-CM | POA: Insufficient documentation

## 2020-11-29 DIAGNOSIS — N907 Vulvar cyst: Secondary | ICD-10-CM | POA: Diagnosis not present

## 2020-11-29 NOTE — Progress Notes (Signed)
WELL-WOMAN EXAMINATION Patient name: Melissa Ayers MRN [id number]  Date of birth: 15-May-2000 Chief Complaint:   Gynecologic Exam  History of Present Illness:   Melissa Ayers is a 21 y.o. G65P0010 Caucasian female being seen today for a routine well-woman exam.  Current complaints: bump Lt vulva x 1wk, no pain  PCP: Garald Balding w/ PCP, on synthroid  Patient's last menstrual period was 11/14/2020 (exact date). The current method of family planning is none, recent SAB in Sept, ok if gets pregnant again, not taking pnv consistently Last pap never, thought she . Results were: N/A. H/O abnormal pap: no Last mammogram: never. Results were: N/A. Family h/o breast cancer: no Last colonoscopy: never. Results were: N/A. Family h/o colorectal cancer: no  Depression screen [facility] 2/9 11/29/2020 09/23/2020  Decreased Interest 0 0  Down, Depressed, Hopeless 0 0  PHQ - 2 Score 0 0  Altered sleeping 0 0  Tired, decreased energy 0 0  Change in appetite 0 0  Feeling bad or failure about yourself  0 0  Trouble concentrating 0 0  Moving slowly or fidgety/restless 0 0  Suicidal thoughts 0 0  PHQ-9 Score 0 0     GAD 7 : Generalized Anxiety Score 11/29/2020 09/23/2020  Nervous, Anxious, on Edge 0 0  Control/stop worrying 0 0  Worry too much - different things 0 0  Trouble relaxing 0 0  Restless 0 0  Easily annoyed or irritable 0 0  Afraid - awful might happen 0 -  Total GAD 7 Score 0 -     Review of Systems:   Pertinent items are noted in HPI Denies any headaches, blurred vision, fatigue, shortness of breath, chest pain, abdominal pain, abnormal vaginal discharge/itching/odor/irritation, problems with periods, bowel movements, urination, or intercourse unless otherwise stated above. Pertinent History Reviewed:  Reviewed past medical,surgical, social and family history.  Reviewed problem list, medications and allergies. Physical Assessment:   Vitals:   11/29/20 1108  BP: 126/76  Pulse: 87   Weight: 186 lb 9.6 oz (84.6 kg)  Height: 5\' 6"  (1.676 m)  Body mass index is 30.12 kg/m.        Physical Examination:   General appearance - well appearing, and in no distress  Mental status - alert, oriented to person, place, and time  Psych:  She has a normal mood and affect  Skin - warm and dry, normal color, no suspicious lesions noted  Chest - effort normal, all lung fields clear to auscultation bilaterally  Heart - normal rate and regular rhythm  Neck:  midline trachea, no thyromegaly or nodules  Breasts - breasts appear normal, no suspicious masses, no skin or nipple changes or  axillary nodes  Abdomen - soft, nontender, nondistended, no masses or organomegaly  Pelvic - VULVA: soft ~2cm round, fluctuant, nontender cyst Lt labia majora-not typical Bartholin's location, co-exam w/ LHE, feels it is a vulvar glandular cyst of some sort, no need to do anything unless it starts bothering her, VAGINA: normal appearing vagina with normal color and discharge, no lesions  CERVIX: normal appearing cervix without discharge or lesions, no CMT  Thin prep pap is done w/ reflex HR HPV cotesting  UTERUS: uterus is felt to be normal size, shape, consistency and nontender   ADNEXA: No adnexal masses or tenderness noted.  Extremities:  No swelling or varicosities noted  Chaperone: Angel Neas    No results found for this or any previous visit (from the  past 24 hour(s)).  Assessment & Plan:  1) Well-Woman Exam  2) Lt vulvar glandular cyst>soft, nontender, pt to let us know if gets bigger/becomes painful,otherwise will leave it along  3) Not preventing pregnancy> take pnv daily, let us know when gets +HPT  Labs/procedures today: pap  Mammogram: @ 22yo, or sooner if problems Colonoscopy: @ 22yo, or sooner if problems  No orders of the defined types were placed in this encounter.   Meds: No orders of the defined types were placed in this encounter.   Follow-up: Return in about 1 year  (around 11/29/2021) for Physical.  Cheral Marker CNM, [facility] 11/29/2020 12:06 PM

## 2020-12-02 LAB — CYTOLOGY - PAP
Chlamydia: NEGATIVE
Comment: NEGATIVE
Comment: NORMAL
Neisseria Gonorrhea: NEGATIVE

## 2020-12-06 ENCOUNTER — Encounter: Payer: Self-pay | Admitting: Women's Health

## 2020-12-06 DIAGNOSIS — R87619 Unspecified abnormal cytological findings in specimens from cervix uteri: Secondary | ICD-10-CM | POA: Insufficient documentation

## 2020-12-14 ENCOUNTER — Encounter: Payer: Self-pay | Admitting: *Deleted

## 2020-12-14 ENCOUNTER — Telehealth: Payer: Self-pay | Admitting: *Deleted

## 2020-12-14 NOTE — Telephone Encounter (Signed)
Attempted to call patient.  No answer.

## 2020-12-14 NOTE — Telephone Encounter (Signed)
-----   Message from Cheral Marker, [place] sent at 12/13/2020 11:59 AM EST ----- Hasn't read [employer]. Please call and read it to her/have her read it. Thanks

## 2020-12-14 NOTE — Telephone Encounter (Signed)
Letter with results sent to patient via certified mail.

## 2020-12-24 ENCOUNTER — Telehealth: Payer: Self-pay | Admitting: Women's Health

## 2020-12-24 NOTE — Telephone Encounter (Signed)
Patient is calling stating that she received a letter about test results and would like for someone to explain the results to her

## 2020-12-24 NOTE — Telephone Encounter (Signed)
Spoke to patient and informed of abnormal pap.  Advised to repeat pap in 1 year. Cyst on vulva seems to be getting bigger but not painful.  Offered to make patient appt but she declined.  Will continue to monitor for now.  Advised is area becomes painful, changes color, etc, to let us know or make appt.  Pt verbalized understanding.

## 2021-02-15 ENCOUNTER — Other Ambulatory Visit: Payer: Self-pay

## 2021-02-15 ENCOUNTER — Ambulatory Visit (INDEPENDENT_AMBULATORY_CARE_PROVIDER_SITE_OTHER): Payer: Medicaid Other | Admitting: *Deleted

## 2021-02-15 VITALS — BP 127/80 | HR 84 | Ht 66.0 in | Wt 196.0 lb

## 2021-02-15 DIAGNOSIS — N926 Irregular menstruation, unspecified: Secondary | ICD-10-CM

## 2021-02-15 DIAGNOSIS — Z3201 Encounter for pregnancy test, result positive: Secondary | ICD-10-CM | POA: Diagnosis not present

## 2021-02-15 LAB — POCT URINE PREGNANCY: Preg Test, Ur: POSITIVE — AB

## 2021-02-15 NOTE — Progress Notes (Signed)
° °  NURSE VISIT- PREGNANCY CONFIRMATION   SUBJECTIVE:  Melissa Ayers is a 21 y.o. G2P0010 female at [redacted]w[redacted]d by certain LMP of Patient's last menstrual period was 01/12/2021 (exact date). Here for pregnancy confirmation.  Home pregnancy test: positive x 3   She reports no complaints.  She is taking prenatal vitamins.    OBJECTIVE:  BP 127/80 (BP Location: Left Arm, Patient Position: Sitting, Cuff Size: Normal)    Pulse 84    Ht 5\' 6"  (1.676 m)    Wt 196 lb (88.9 kg)    LMP 01/12/2021 (Exact Date)    BMI 31.64 kg/m   Appears well, in no apparent distress  Results for orders placed or performed in visit on 02/15/21 (from the past 24 hour(s))  POCT urine pregnancy   Collection Time: 02/15/21 10:23 AM  Result Value Ref Range   Preg Test, Ur Positive (A) Negative    ASSESSMENT: Positive pregnancy test, [redacted]w[redacted]d by LMP    PLAN: Schedule for dating ultrasound in 3 weeks Prenatal vitamins: continue   Nausea medicines: not currently needed   OB packet given: Yes  [redacted]w[redacted]d  02/15/2021 10:23 AM

## 2021-03-08 ENCOUNTER — Other Ambulatory Visit: Payer: Self-pay | Admitting: Obstetrics & Gynecology

## 2021-03-08 DIAGNOSIS — O3680X0 Pregnancy with inconclusive fetal viability, not applicable or unspecified: Secondary | ICD-10-CM

## 2021-03-09 ENCOUNTER — Ambulatory Visit (INDEPENDENT_AMBULATORY_CARE_PROVIDER_SITE_OTHER): Payer: Medicaid Other

## 2021-03-09 ENCOUNTER — Other Ambulatory Visit: Payer: Self-pay

## 2021-03-09 DIAGNOSIS — Z3A08 8 weeks gestation of pregnancy: Secondary | ICD-10-CM | POA: Diagnosis not present

## 2021-03-09 DIAGNOSIS — O3680X0 Pregnancy with inconclusive fetal viability, not applicable or unspecified: Secondary | ICD-10-CM | POA: Diagnosis not present

## 2021-03-09 NOTE — Progress Notes (Signed)
[place] 8 wks,single IUP with YS,FHR 164 bpm,normal left ovary,complex right corpus luteal cyst 2.7 x 1.9 x 2.2 cm,CRL 16.45 mm

## 2021-04-11 ENCOUNTER — Encounter: Payer: Self-pay | Admitting: Obstetrics & Gynecology

## 2021-04-11 ENCOUNTER — Other Ambulatory Visit: Payer: Self-pay | Admitting: Obstetrics & Gynecology

## 2021-04-11 DIAGNOSIS — Z3682 Encounter for antenatal screening for nuchal translucency: Secondary | ICD-10-CM

## 2021-04-11 DIAGNOSIS — Z3490 Encounter for supervision of normal pregnancy, unspecified, unspecified trimester: Secondary | ICD-10-CM | POA: Insufficient documentation

## 2021-04-12 ENCOUNTER — Ambulatory Visit (INDEPENDENT_AMBULATORY_CARE_PROVIDER_SITE_OTHER): Payer: Medicaid Other | Admitting: Obstetrics & Gynecology

## 2021-04-12 ENCOUNTER — Other Ambulatory Visit: Payer: Self-pay

## 2021-04-12 ENCOUNTER — Encounter: Payer: Self-pay | Admitting: Obstetrics & Gynecology

## 2021-04-12 ENCOUNTER — Ambulatory Visit: Payer: Medicaid Other | Admitting: *Deleted

## 2021-04-12 ENCOUNTER — Ambulatory Visit (INDEPENDENT_AMBULATORY_CARE_PROVIDER_SITE_OTHER): Payer: Medicaid Other

## 2021-04-12 VITALS — BP 119/66 | HR 82 | Wt 191.8 lb

## 2021-04-12 DIAGNOSIS — E039 Hypothyroidism, unspecified: Secondary | ICD-10-CM

## 2021-04-12 DIAGNOSIS — Z3480 Encounter for supervision of other normal pregnancy, unspecified trimester: Secondary | ICD-10-CM

## 2021-04-12 DIAGNOSIS — Z3682 Encounter for antenatal screening for nuchal translucency: Secondary | ICD-10-CM

## 2021-04-12 DIAGNOSIS — Z3A12 12 weeks gestation of pregnancy: Secondary | ICD-10-CM

## 2021-04-12 LAB — POCT URINALYSIS DIPSTICK OB
Blood, UA: NEGATIVE
Glucose, UA: NEGATIVE
Ketones, UA: NEGATIVE
Leukocytes, UA: NEGATIVE
Nitrite, UA: NEGATIVE
POC,PROTEIN,UA: NEGATIVE

## 2021-04-12 MED ORDER — ASPIRIN EC 81 MG PO TBEC: 81.0000 mg | DELAYED_RELEASE_TABLET | Freq: Every day | ORAL | 4 refills | Status: AC

## 2021-04-12 MED ORDER — BLOOD PRESSURE MONITOR MISC
0 refills | Status: AC
Start: 2021-04-12 — End: ?

## 2021-04-12 NOTE — Progress Notes (Signed)
? ?INITIAL OBSTETRICAL VISIT ?Patient name: Melissa Ayers MRN [id number]  Date of birth: Nov 25, 2000 ?Chief Complaint:   ?Initial Prenatal Visit ? ?History of Present Illness:   ?Melissa Ayers is a 21 y.o. G2P0010 female at [redacted]w[redacted]d by LMP c/w u/s at 8 weeks with an Estimated Date of Delivery: 10/19/21 being seen today for her initial obstetrical visit.   ?Her obstetrical history is significant for: ? ?-Hypothyroidism- on synthroid 13mcg daily ?-HSV2- outbreak many years ago, taking valtrex (partner is not aware) ? ?Today she reports no complaints.   Mild nausea, no vomiting.  Denies pelvic or abdominal pain.  Denies vaginal bleeding.   ? ?Vaginal bump: States its been present for many months.  States that it has been the same size, non-painful, no discharge.  No other acute complaints ? ?Depression screen [facility] 2/9 04/12/2021 11/29/2020 09/23/2020  ?Decreased Interest 0 0 0  ?Down, Depressed, Hopeless 0 0 0  ?PHQ - 2 Score 0 0 0  ?Altered sleeping 0 0 0  ?Tired, decreased energy 0 0 0  ?Change in appetite 0 0 0  ?Feeling bad or failure about yourself  0 0 0  ?Trouble concentrating 0 0 0  ?Moving slowly or fidgety/restless 0 0 0  ?Suicidal thoughts 0 0 0  ?PHQ-9 Score 0 0 0  ? ? ?Patient's last menstrual period was 01/12/2021 (exact date). ?Last pap 11/ 2022 Results were:  LSIL ?Review of Systems:   ?Pertinent items are noted in HPI ?Denies cramping/contractions, leakage of fluid, vaginal bleeding, abnormal vaginal discharge w/ itching/odor/irritation, headaches, visual changes, shortness of breath, chest pain, abdominal pain, severe nausea/vomiting, or problems with urination or bowel movements unless otherwise stated above.  ?Pertinent History Reviewed:  ?Reviewed past medical,surgical, social, obstetrical and family history.  ?Reviewed problem list, medications and allergies. ?OB History  ?Gravida Para Term Preterm AB Living  ?2       1    ?SAB IAB Ectopic Multiple Live Births  ?1          ?  ?# Outcome Date GA Lbr Len/2nd  Weight Sex Delivery Anes PTL Lv  ?2 Current           ?1 SAB 09/14/20 [redacted]w[redacted]d         ? ?Physical Assessment:  ? ?Vitals:  ? 04/12/21 1359  ?BP: 119/66  ?Pulse: 82  ?Weight: 191 lb 12.8 oz (87 kg)  ?Body mass index is 30.96 kg/m?. ? ?     Physical Examination: ? General appearance - well appearing, and in no distress ? Mental status - alert, oriented to person, place, and time ? Psych:  She has a normal mood and affect ? Skin - warm and dry, normal color, no suspicious lesions noted ? Chest - effort normal, all lung fields clear to auscultation bilaterally ? Breast- no masses or abnormalities noted, nipples unremarkable ? Heart - normal rate and regular rhythm ? Abdomen - soft, nontender ? Extremities:  No swelling or varicosities noted ? Pelvic - VULVA: normal appearing vulva with no masses, tenderness or lesions  VAGINA: normal appearing vagina with normal color and discharge, no lesions, no abnormality noted CERVIX: normal appearing cervix without discharge or lesions, no CMT ?  ?Chaperone:  Dr. Larae Grooms    ? ?TODAY'S NT normal ? ?Results for orders placed or performed in visit on 04/12/21 (from the past 24 hour(s))  ?POC Urinalysis Dipstick OB  ? Collection Time: 04/12/21  2:33 PM  ?Result Value Ref Range  ? Color,  UA    ? Clarity, UA    ? Glucose, UA Negative Negative  ? Bilirubin, UA    ? Ketones, UA neg   ? Spec Grav, UA    ? Blood, UA neg   ? pH, UA    ? POC,PROTEIN,UA Negative Negative, Trace, Small (1+), Moderate (2+), Large (3+), 4+  ? Urobilinogen, UA    ? Nitrite, UA neg   ? Leukocytes, UA Negative Negative  ? Appearance    ? Odor    ?  ?Assessment & Plan:  ?1) Low-Risk Pregnancy G2P0010 at [redacted]w[redacted]d with an Estimated Date of Delivery: 10/19/21  ? ?2) Initial OB visit ? ?3) Due to BMI and nullip-started on ASA daily ? ?Meds:  ?Meds ordered this encounter  ?Medications  ? Blood Pressure Monitor MISC  ?  Sig: For regular home bp monitoring during pregnancy  ?  Dispense:  1 each  ?  Refill:  0  ?  Z34.81 ?Please  mail to patient  ? aspirin EC 81 MG tablet  ?  Sig: Take 1 tablet (81 mg total) by mouth daily. Swallow whole.  ?  Dispense:  90 tablet  ?  Refill:  4  ? ? ?Initial labs obtained ?Continue prenatal vitamins ?Reviewed n/v relief measures and warning s/s to report ?Reviewed recommended weight gain based on pre-gravid BMI ?Encouraged well-balanced diet ?Genetic & carrier screening discussed: requests Panorama, ?Ultrasound discussed; fetal survey: results reviewed ?CCNC completed> form faxed if has or is planning to apply for medicaid ?The nature of [place] for [facility] with multiple MDs and other Advanced Practice Providers was explained to patient; also emphasized that fellows, residents, and students are part of our team. ?PT does not have home bp cuff. Rx sent in. Check bp weekly, let us know if >140/90.  ? ?Indications for ASA therapy (per uptodate) ?One of the following: ?H/O preeclampsia, especially early onset/adverse outcome No ?Multifetal gestation No ?CHTN No ?T1DM or T2DM No ?Chronic kidney disease No ?Autoimmune disease (antiphospholipid syndrome, systemic lupus erythematosus) No ? ?OR Two or more of the following: ?Nulliparity Yes ?Obesity (BMI>30 kg/m2) Yes ?Family h/o preeclampsia in mother or sister No ?Age ?35 years No ?Sociodemographic characteristics (African American race, low socioeconomic level) No ?Personal risk factors (eg, previous pregnancy w/ LBW or SGA, previous adverse pregnancy outcome [eg, stillbirth], interval >10 years between pregnancies) No ? ?Indications for early A1C (per uptodate) ?BMI >=25 (>=23 in Asian women) AND one of the following ?GDM in a previous pregnancy No ?Previous A1C?5.7, impaired glucose tolerance, or impaired fasting glucose on previous testing No ?First-degree relative with diabetes No ?High-risk race/ethnicity (eg, African American, Latino, Native American, [place] American, Pacific Islander) No ?History of cardiovascular disease No ?HTN or on  therapy for hypertension No ?HDL cholesterol level <35 mg/dL (0.90 mmol/L) and/or a triglyceride level >250 mg/dL (2.82 mmol/L) No ?PCOS No ?Physical inactivity No ?Other clinical condition associated with insulin resistance (eg, severe obesity, acanthosis nigricans) No ?Previous birth of an infant weighing ?4000 g No ?Previous stillbirth of unknown cause No ?>= 40yo No ? ?Follow-up: Return in about 4 weeks (around 05/10/2021) for [place] visit.  ? ?Orders Placed This Encounter  ?Procedures  ? Urine Culture  ? GC/Chlamydia Probe Amp  ? Integrated 1  ? Genetic Screening  ? CBC/D/Plt+RPR+Rh+ABO+RubIgG...  ? TSH  ? POC Urinalysis Dipstick OB  ? ?Janyth Pupa, DO ?Attending [place] ?[facility] [place] ? ? ?

## 2021-04-12 NOTE — Progress Notes (Signed)
[place] 12+6 wks,measurements c/w dates,CRL 69.11 mm,NB present,NT 1.5 mm,FHR 150 bpm,normal ovaries,anterior placenta  ?

## 2021-04-13 LAB — TSH: TSH: 3.07 u[IU]/mL (ref 0.450–4.500)

## 2021-04-14 LAB — GC/CHLAMYDIA PROBE AMP
Chlamydia trachomatis, NAA: NEGATIVE
Neisseria Gonorrhoeae by PCR: NEGATIVE

## 2021-04-14 LAB — URINE CULTURE

## 2021-04-14 LAB — OB RESULTS CONSOLE GC/CHLAMYDIA: Neisseria Gonorrhea: NEGATIVE

## 2021-05-02 LAB — INTEGRATED 1
Crown Rump Length: 69.1 mm
Gest. Age on Collection Date: 13 weeks
Maternal Age at EDD: 23.1 yr
Nuchal Translucency (NT): 1.5 mm
Number of Fetuses: 1
PAPP-A Value: 404.4 ng/mL
Weight: 191 [lb_av]

## 2021-05-02 LAB — CBC/D/PLT+RPR+RH+ABO+RUBIGG...
Antibody Screen: NEGATIVE
Basophils Absolute: 0 10*3/uL (ref 0.0–0.2)
Basos: 0 %
EOS (ABSOLUTE): 0.1 10*3/uL (ref 0.0–0.4)
Eos: 1 %
HCV Ab: <0.1
HIV Screen 4th Generation wRfx: NONREACTIVE
Hematocrit: 36.8 % (ref 34.0–46.6)
Hemoglobin: 12.5 g/dL (ref 11.1–15.9)
Hepatitis B Surface Ag: NEGATIVE
Immature Grans (Abs): 0 10*3/uL (ref 0.0–0.1)
Immature Granulocytes: 0 %
Lymphocytes Absolute: 1.9 10*3/uL (ref 0.7–3.1)
Lymphs: 21 %
MCH: 30.5 pg (ref 26.6–33.0)
MCHC: 34 g/dL (ref 31.5–35.7)
MCV: 90 fL (ref 79–97)
Monocytes Absolute: 0.5 10*3/uL (ref 0.1–0.9)
Monocytes: 5 %
Neutrophils Absolute: 6.3 10*3/uL (ref 1.4–7.0)
Neutrophils: 73 %
Platelets: 254 10*3/uL (ref 150–450)
RBC: 4.1 x10E6/uL (ref 3.77–5.28)
RDW: 12.2 % (ref 11.7–15.4)
RPR Ser Ql: NONREACTIVE
Rh Factor: POSITIVE
Rubella Antibodies, IGG: 1.82 index (ref >0.99)
WBC: 8.7 10*3/uL (ref 3.4–10.8)

## 2021-05-10 ENCOUNTER — Ambulatory Visit (INDEPENDENT_AMBULATORY_CARE_PROVIDER_SITE_OTHER): Payer: Medicaid Other | Admitting: Women's Health

## 2021-05-10 ENCOUNTER — Encounter: Payer: Self-pay | Admitting: Women's Health

## 2021-05-10 VITALS — BP 128/79 | HR 85 | Wt 191.0 lb

## 2021-05-10 DIAGNOSIS — Z363 Encounter for antenatal screening for malformations: Secondary | ICD-10-CM

## 2021-05-10 DIAGNOSIS — Z3482 Encounter for supervision of other normal pregnancy, second trimester: Secondary | ICD-10-CM

## 2021-05-10 DIAGNOSIS — Z3480 Encounter for supervision of other normal pregnancy, unspecified trimester: Secondary | ICD-10-CM

## 2021-05-10 DIAGNOSIS — Z1379 Encounter for other screening for genetic and chromosomal anomalies: Secondary | ICD-10-CM

## 2021-05-10 NOTE — Patient Instructions (Signed)
Melissa Ayers, thank you for choosing our office today! We appreciate the opportunity to meet your healthcare needs. You may receive a short survey by mail, e-mail, or through [employer]. If you are happy with your care we would appreciate if you could take just a few minutes to complete the survey questions. We read all of your comments and take your feedback very seriously. Thank you again for choosing our office.  ?Center for [employer] Team at [facility] ?[facility] at [facility] ?([address] [place], [place] [address]) ?Entrance C, located off of [address] ?Free 24/7 valet parking  ?Go to [email or web address] to register for FREE online childbirth classes ? ?Call the office [phone]) or go to [facility] if: ?You begin to severe cramping ?Your water breaks.  Sometimes it is a big gush of fluid, sometimes it is just a trickle that keeps getting your panties wet or running down your legs ?You have vaginal bleeding.  It is normal to have a small amount of spotting if your cervix was checked.  ? ?[place] Pediatricians/Family Doctors ?[facility]): [address], [phone]           ?[facility]: [address], [phone]                ?[facility]): [address], [phone] (call to ask if accepting patients) ?[facility] Department: [address], [place], [phone]   ? ?[place] Pediatricians/Family Doctors ?[facility]): [address], [phone] ?[facility]: [address] [place], [phone] ?[facility] [place]: [address], [phone] ? ?[employer] Family Doctors  ?[facility]): [phone] ?[facility]: [address], [phone]  ? ?[facility] Family Doctors ?[facility]: [address] [phone] ? ?[facility]  ?[facility]: [phone], [phone] ? ?Home Blood Pressure Monitoring for Patients  ? ?Your provider has recommended that you check your blood pressure (BP) at least once a week at home. If you do not have a blood pressure cuff at home, one will be provided for you. Contact your provider if you have not received your monitor within 1 week.  ? ?Helpful Tips for Accurate Home Blood Pressure Checks  ?Don't smoke, exercise, or drink caffeine 30 minutes before checking your BP ?Use the restroom before checking your BP (a full bladder can raise your pressure) ?Relax in a comfortable upright chair ?Feet on the ground ?Left arm resting comfortably on a flat surface at the level of your heart ?Legs uncrossed ?Back supported ?Sit quietly and don't talk ?Place the cuff on your bare arm ?Adjust snuggly, so that only two fingertips can fit between your skin and the top of the cuff ?Check 2 readings separated by at least one minute ?Keep a log of your BP readings ?For a visual, please reference this diagram: http://ccnc.care/bpdiagram ? ?Provider Name: [facility]     Phone: [phone] ? ?Zone 1: ALL CLEAR  ?Continue to monitor your symptoms:  ?BP reading is less than 140 (top number) or less than 90 (bottom number)  ?No right upper stomach pain ?No headaches or seeing spots ?No feeling nauseated or throwing up ?No swelling in face and hands ? ?Zone 2: CAUTION ?Call your doctor's office for any of the following:  ?BP reading is greater than 140 (top number) or greater than  90 (bottom number)  ?Stomach pain under your ribs in the middle or right side ?Headaches or seeing spots ?Feeling nauseated or throwing up ?Swelling in face and hands ? ?Zone 3: EMERGENCY  ?Seek immediate medical care if you have any of the following:  ?BP reading is greater than160 (top number) or greater than 110 (bottom number) ?Severe headaches not improving with Tylenol ?Serious difficulty catching your breath ?Any worsening symptoms from  Zone 2  ? ?  ?Second Trimester of Pregnancy ?The second trimester is from week 14 through week 27 (months 4 through 6). The second trimester is often a time when you feel your best. Your body has adjusted to being pregnant, and you begin to feel better physically. Usually, morning sickness has lessened or quit completely, you may have more energy, and you may have an increase in appetite. The second trimester is also a time when the fetus is growing rapidly. At the end of the sixth month, the fetus is about 9 inches long and weighs about 1? pounds. You will likely begin to feel the baby move (quickening) between 16 and 20 weeks of pregnancy. ?Body changes during your second trimester ?Your body continues to go through many changes during your second trimester. The changes vary from woman to woman. ?Your weight will continue to increase. You will notice your lower abdomen bulging out. ?You may begin to get stretch marks on your hips, abdomen, and breasts. ?You may develop headaches that can be relieved by medicines. The medicines should be approved by your health care provider. ?You may urinate more often because the fetus is pressing on your bladder. ?You may develop or continue to have heartburn as a result of your pregnancy. ?You may develop constipation because certain hormones are causing the muscles that push waste through your intestines to slow down. ?You may develop hemorrhoids or swollen, bulging veins (varicose veins). ?You may have back pain. This is caused by: ?Weight gain. ?Pregnancy hormones that are relaxing the joints in your pelvis. ?A shift in weight and the muscles that support your balance. ?Your breasts will continue to grow and they will continue to become tender. ?Your gums may bleed and may be sensitive to brushing and flossing. ?Dark spots or blotches (chloasma, mask of pregnancy) may develop on your face. This will likely fade after the baby is born. ?A dark line from your belly button to  the pubic area (linea nigra) may appear. This will likely fade after the baby is born. ?You may have changes in your hair. These can include thickening of your hair, rapid growth, and changes in texture. Some women also have hair loss during or after pregnancy, or hair that feels dry or thin. Your hair will most likely return to normal after your baby is born. ? ?What to expect at prenatal visits ?During a routine prenatal visit: ?You will be weighed to make sure you and the fetus are growing normally. ?Your blood pressure will be taken. ?Your abdomen will be measured to track your baby's growth. ?The fetal heartbeat will be listened to. ?Any test results from the previous visit will be discussed. ? ?Your health care provider may ask you: ?How you are feeling. ?If you are feeling the baby move. ?If you have had any abnormal symptoms, such as leaking fluid, bleeding, severe headaches, or abdominal cramping. ?If you are using any tobacco products, including cigarettes, chewing tobacco, and electronic cigarettes. ?If you have any questions. ? ?Other tests that may be performed during  your second trimester include: ?Blood tests that check for: ?Low iron levels (anemia). ?High blood sugar that affects pregnant women (gestational diabetes) between 31 and 28 weeks. ?Rh antibodies. This is to check for a protein on red blood cells (Rh factor). ?Urine tests to check for infections, diabetes, or protein in the urine. ?An ultrasound to confirm the proper growth and development of the baby. ?An amniocentesis to check for possible genetic problems. ?Fetal screens for spina bifida and Down syndrome. ?HIV (human immunodeficiency virus) testing. Routine prenatal testing includes screening for HIV, unless you choose not to have this test. ? ?Follow these instructions at home: ?Medicines ?Follow your health care provider's instructions regarding medicine use. Specific medicines may be either safe or unsafe to take during  pregnancy. ?Take a prenatal vitamin that contains at least 600 micrograms (mcg) of folic acid. ?If you develop constipation, try taking a stool softener if your health care provider approves. ?Eating and drinking ?Eat a

## 2021-05-10 NOTE — Progress Notes (Signed)
? ? ?LOW-RISK PREGNANCY VISIT ?Patient name: Melissa Ayers MRN [id number]  Date of birth: Jan 03, 2001 ?Chief Complaint:   ?Routine Prenatal Visit ? ?History of Present Illness:   ?Melissa Ayers is a 21 y.o. G2P0010 female at [redacted]w[redacted]d with an Estimated Date of Delivery: 10/19/21 being seen today for ongoing management of a low-risk pregnancy.  ? ?Today she reports no complaints. Contractions: Not present. Vag. Bleeding: None.  Movement: Absent. denies leaking of fluid. ? ? ?  04/12/2021  ?  2:00 PM 11/29/2020  ? 11:13 AM 09/23/2020  ?  2:33 PM  ?Depression screen PHQ 2/9  ?Decreased Interest 0 0 0  ?Down, Depressed, Hopeless 0 0 0  ?PHQ - 2 Score 0 0 0  ?Altered sleeping 0 0 0  ?Tired, decreased energy 0 0 0  ?Change in appetite 0 0 0  ?Feeling bad or failure about yourself  0 0 0  ?Trouble concentrating 0 0 0  ?Moving slowly or fidgety/restless 0 0 0  ?Suicidal thoughts 0 0 0  ?PHQ-9 Score 0 0 0  ? ?  ? ?  04/12/2021  ?  2:00 PM 11/29/2020  ? 11:14 AM 09/23/2020  ?  2:33 PM  ?GAD 7 : Generalized Anxiety Score  ?Nervous, Anxious, on Edge 0 0 0  ?Control/stop worrying 0 0 0  ?Worry too much - different things 0 0 0  ?Trouble relaxing 0 0 0  ?Restless 0 0 0  ?Easily annoyed or irritable 0 0 0  ?Afraid - awful might happen 0 0   ?Total GAD 7 Score 0 0   ? ? ?  ?Review of Systems:   ?Pertinent items are noted in HPI ?Denies abnormal vaginal discharge w/ itching/odor/irritation, headaches, visual changes, shortness of breath, chest pain, abdominal pain, severe nausea/vomiting, or problems with urination or bowel movements unless otherwise stated above. ?Pertinent History Reviewed:  ?Reviewed past medical,surgical, social, obstetrical and family history.  ?Reviewed problem list, medications and allergies. ?Physical Assessment:  ? ?Vitals:  ? 05/10/21 1338  ?BP: 128/79  ?Pulse: 85  ?Weight: 191 lb (86.6 kg)  ?Body mass index is 30.83 kg/m?. ?  ?     Physical Examination:  ? General appearance: Well appearing, and in no distress ? Mental  status: Alert, oriented to person, place, and time ? Skin: Warm & dry ? Cardiovascular: Normal heart rate noted ? Respiratory: Normal respiratory effort, no distress ? Abdomen: Soft, gravid, nontender ? Pelvic: Cervical exam deferred        ? Extremities: Edema: None ? ?Fetal Status: Fetal Heart Rate (bpm): 148   Movement: Absent   ? ?Chaperone: N/A   ?No results found for this or any previous visit (from the past 24 hour(s)).  ?Assessment & Plan:  ?1) Low-risk pregnancy G2P0010 at [redacted]w[redacted]d with an Estimated Date of Delivery: 10/19/21  ?  ?Meds: No orders of the defined types were placed in this encounter. ? ?Labs/procedures today: 2nd IT ? ?Plan:  Continue routine obstetrical care  ?Next visit: prefers will be in person for u/s    ? ?Reviewed: Preterm labor symptoms and general obstetric precautions including but not limited to vaginal bleeding, contractions, leaking of fluid and fetal movement were reviewed in detail with the patient.  All questions were answered. Does have home bp cuff. Office bp cuff given: not applicable. Check bp weekly, let us know if consistently >140 and/or >90. ? ?Follow-up: Return in about 3 weeks (around 05/31/2021) for LROB, PT:WSFKCLE, CNM, in person. ? ?Future  Appointments  ?Date Time Provider Department Center  ?05/31/2021  1:30 PM [facility] [place] [facility] None  ? ? ?Orders Placed This Encounter  ?Procedures  ? US OB Comp + 14 Wk  ? INTEGRATED 2  ? ?Cheral Marker CNM, WHNP-BC ?05/10/2021 ?1:59 PM  ?

## 2021-05-12 LAB — INTEGRATED 2
AFP MoM: 0.78
Alpha-Fetoprotein: 24.4 ng/mL
Crown Rump Length: 69.1 mm
DIA MoM: 1.05
DIA Value: 137.7 pg/mL
Estriol, Unconjugated: 1.11 ng/mL
Gest. Age on Collection Date: 13 weeks
Gestational Age: 17 weeks
Maternal Age at EDD: 23.1 yr
Nuchal Translucency (NT): 1.5 mm
Nuchal Translucency MoM: 0.96
Number of Fetuses: 1
PAPP-A MoM: 0.44
PAPP-A Value: 404.4 ng/mL
Test Results:: NEGATIVE
Weight: 191 [lb_av]
Weight: 191 [lb_av]
hCG MoM: 1.06
hCG Value: 27.6 IU/mL
uE3 MoM: 1.04

## 2021-05-31 ENCOUNTER — Ambulatory Visit (INDEPENDENT_AMBULATORY_CARE_PROVIDER_SITE_OTHER): Payer: Medicaid Other

## 2021-05-31 ENCOUNTER — Encounter: Payer: Self-pay | Admitting: Women's Health

## 2021-05-31 ENCOUNTER — Ambulatory Visit (INDEPENDENT_AMBULATORY_CARE_PROVIDER_SITE_OTHER): Payer: Medicaid Other | Admitting: Women's Health

## 2021-05-31 VITALS — BP 128/67 | HR 87 | Wt 193.0 lb

## 2021-05-31 DIAGNOSIS — Z3482 Encounter for supervision of other normal pregnancy, second trimester: Secondary | ICD-10-CM | POA: Diagnosis not present

## 2021-05-31 DIAGNOSIS — Z3A19 19 weeks gestation of pregnancy: Secondary | ICD-10-CM

## 2021-05-31 DIAGNOSIS — Z362 Encounter for other antenatal screening follow-up: Secondary | ICD-10-CM | POA: Diagnosis not present

## 2021-05-31 DIAGNOSIS — Z363 Encounter for antenatal screening for malformations: Secondary | ICD-10-CM

## 2021-05-31 DIAGNOSIS — Z3480 Encounter for supervision of other normal pregnancy, unspecified trimester: Secondary | ICD-10-CM

## 2021-05-31 DIAGNOSIS — E039 Hypothyroidism, unspecified: Secondary | ICD-10-CM | POA: Insufficient documentation

## 2021-05-31 NOTE — Patient Instructions (Signed)
Melissa Ayers, thank you for choosing our office today! We appreciate the opportunity to meet your healthcare needs. You may receive a short survey by mail, e-mail, or through [employer]. If you are happy with your care we would appreciate if you could take just a few minutes to complete the survey questions. We read all of your comments and take your feedback very seriously. Thank you again for choosing our office.  ?Center for [employer] Team at [facility] ?[facility] at [facility] ?([address] [place], [place] [address]) ?Entrance C, located off of [address] ?Free 24/7 valet parking  ?Go to [email or web address] to register for FREE online childbirth classes ? ?Call the office [phone]) or go to [facility] if: ?You begin to severe cramping ?Your water breaks.  Sometimes it is a big gush of fluid, sometimes it is just a trickle that keeps getting your panties wet or running down your legs ?You have vaginal bleeding.  It is normal to have a small amount of spotting if your cervix was checked.  ? ?[place] Pediatricians/Family Doctors ?[facility]): [address], [phone]           ?[facility]: [address], [phone]                ?[facility]): [address], [phone] (call to ask if accepting patients) ?[facility] Department: [address], [place], [phone]   ? ?[place] Pediatricians/Family Doctors ?[facility]): [address], [phone] ?[facility]: [address] [place], [phone] ?[facility] [place]: [address], [phone] ? ?[employer] Family Doctors  ?[facility]): [phone] ?[facility]: [address], [phone]  ? ?[facility] Family Doctors ?[facility]: [address] [phone] ? ?[facility]  ?[facility]: [phone], [phone] ? ?Home Blood Pressure Monitoring for Patients  ? ?Your provider has recommended that you check your blood pressure (BP) at least once a week at home. If you do not have a blood pressure cuff at home, one will be provided for you. Contact your provider if you have not received your monitor within 1 week.  ? ?Helpful Tips for Accurate Home Blood Pressure Checks  ?Don't smoke, exercise, or drink caffeine 30 minutes before checking your BP ?Use the restroom before checking your BP (a full bladder can raise your pressure) ?Relax in a comfortable upright chair ?Feet on the ground ?Left arm resting comfortably on a flat surface at the level of your heart ?Legs uncrossed ?Back supported ?Sit quietly and don't talk ?Place the cuff on your bare arm ?Adjust snuggly, so that only two fingertips can fit between your skin and the top of the cuff ?Check 2 readings separated by at least one minute ?Keep a log of your BP readings ?For a visual, please reference this diagram: http://ccnc.care/bpdiagram ? ?Provider Name: [facility]     Phone: [phone] ? ?Zone 1: ALL CLEAR  ?Continue to monitor your symptoms:  ?BP reading is less than 140 (top number) or less than 90 (bottom number)  ?No right upper stomach pain ?No headaches or seeing spots ?No feeling nauseated or throwing up ?No swelling in face and hands ? ?Zone 2: CAUTION ?Call your doctor's office for any of the following:  ?BP reading is greater than 140 (top number) or greater than  90 (bottom number)  ?Stomach pain under your ribs in the middle or right side ?Headaches or seeing spots ?Feeling nauseated or throwing up ?Swelling in face and hands ? ?Zone 3: EMERGENCY  ?Seek immediate medical care if you have any of the following:  ?BP reading is greater than160 (top number) or greater than 110 (bottom number) ?Severe headaches not improving with Tylenol ?Serious difficulty catching your breath ?Any worsening symptoms from  Zone 2  ? ?  ?Second Trimester of Pregnancy ?The second trimester is from week 14 through week 27 (months 4 through 6). The second trimester is often a time when you feel your best. Your body has adjusted to being pregnant, and you begin to feel better physically. Usually, morning sickness has lessened or quit completely, you may have more energy, and you may have an increase in appetite. The second trimester is also a time when the fetus is growing rapidly. At the end of the sixth month, the fetus is about 9 inches long and weighs about 1? pounds. You will likely begin to feel the baby move (quickening) between 16 and 20 weeks of pregnancy. ?Body changes during your second trimester ?Your body continues to go through many changes during your second trimester. The changes vary from woman to woman. ?Your weight will continue to increase. You will notice your lower abdomen bulging out. ?You may begin to get stretch marks on your hips, abdomen, and breasts. ?You may develop headaches that can be relieved by medicines. The medicines should be approved by your health care provider. ?You may urinate more often because the fetus is pressing on your bladder. ?You may develop or continue to have heartburn as a result of your pregnancy. ?You may develop constipation because certain hormones are causing the muscles that push waste through your intestines to slow down. ?You may develop hemorrhoids or swollen, bulging veins (varicose veins). ?You may have back pain. This is caused by: ?Weight gain. ?Pregnancy hormones that are relaxing the joints in your pelvis. ?A shift in weight and the muscles that support your balance. ?Your breasts will continue to grow and they will continue to become tender. ?Your gums may bleed and may be sensitive to brushing and flossing. ?Dark spots or blotches (chloasma, mask of pregnancy) may develop on your face. This will likely fade after the baby is born. ?A dark line from your belly button to  the pubic area (linea nigra) may appear. This will likely fade after the baby is born. ?You may have changes in your hair. These can include thickening of your hair, rapid growth, and changes in texture. Some women also have hair loss during or after pregnancy, or hair that feels dry or thin. Your hair will most likely return to normal after your baby is born. ? ?What to expect at prenatal visits ?During a routine prenatal visit: ?You will be weighed to make sure you and the fetus are growing normally. ?Your blood pressure will be taken. ?Your abdomen will be measured to track your baby's growth. ?The fetal heartbeat will be listened to. ?Any test results from the previous visit will be discussed. ? ?Your health care provider may ask you: ?How you are feeling. ?If you are feeling the baby move. ?If you have had any abnormal symptoms, such as leaking fluid, bleeding, severe headaches, or abdominal cramping. ?If you are using any tobacco products, including cigarettes, chewing tobacco, and electronic cigarettes. ?If you have any questions. ? ?Other tests that may be performed during  your second trimester include: ?Blood tests that check for: ?Low iron levels (anemia). ?High blood sugar that affects pregnant women (gestational diabetes) between 31 and 28 weeks. ?Rh antibodies. This is to check for a protein on red blood cells (Rh factor). ?Urine tests to check for infections, diabetes, or protein in the urine. ?An ultrasound to confirm the proper growth and development of the baby. ?An amniocentesis to check for possible genetic problems. ?Fetal screens for spina bifida and Down syndrome. ?HIV (human immunodeficiency virus) testing. Routine prenatal testing includes screening for HIV, unless you choose not to have this test. ? ?Follow these instructions at home: ?Medicines ?Follow your health care provider's instructions regarding medicine use. Specific medicines may be either safe or unsafe to take during  pregnancy. ?Take a prenatal vitamin that contains at least 600 micrograms (mcg) of folic acid. ?If you develop constipation, try taking a stool softener if your health care provider approves. ?Eating and drinking ?Eat a

## 2021-05-31 NOTE — Progress Notes (Signed)
[place] 19+6 wks,breech,anterior placenta gr 0,normal ovaries,cx 4.4 cm,SVP of fluid 4.4 cm,FHR 148 bpm,EFW 342 g 67%,anatomy complete,no obvious abnormalities  ?

## 2021-05-31 NOTE — Progress Notes (Signed)
? ? ?LOW-RISK PREGNANCY VISIT ?Patient name: Melissa Ayers MRN [id number]  Date of birth: [birth year] ?Chief Complaint:   ?Routine Prenatal Visit ([place] today) ? ?History of Present Illness:   ?Melissa Ayers is a 21 y.o. G2P0010 female at [redacted]w[redacted]d with an Estimated Date of Delivery: 10/19/21 being seen today for ongoing management of a low-risk pregnancy.  ? ?Today she reports no complaints. Contractions: Not present. Vag. Bleeding: None.  Movement: Absent. denies leaking of fluid. ? ? ?  04/12/2021  ?  2:00 PM 11/29/2020  ? 11:13 AM 09/23/2020  ?  2:33 PM  ?Depression screen PHQ 2/9  ?Decreased Interest 0 0 0  ?Down, Depressed, Hopeless 0 0 0  ?PHQ - 2 Score 0 0 0  ?Altered sleeping 0 0 0  ?Tired, decreased energy 0 0 0  ?Change in appetite 0 0 0  ?Feeling bad or failure about yourself  0 0 0  ?Trouble concentrating 0 0 0  ?Moving slowly or fidgety/restless 0 0 0  ?Suicidal thoughts 0 0 0  ?PHQ-9 Score 0 0 0  ? ?  ? ?  04/12/2021  ?  2:00 PM 11/29/2020  ? 11:14 AM 09/23/2020  ?  2:33 PM  ?GAD 7 : Generalized Anxiety Score  ?Nervous, Anxious, on Edge 0 0 0  ?Control/stop worrying 0 0 0  ?Worry too much - different things 0 0 0  ?Trouble relaxing 0 0 0  ?Restless 0 0 0  ?Easily annoyed or irritable 0 0 0  ?Afraid - awful might happen 0 0   ?Total GAD 7 Score 0 0   ? ? ?  ?Review of Systems:   ?Pertinent items are noted in HPI ?Denies abnormal vaginal discharge w/ itching/odor/irritation, headaches, visual changes, shortness of breath, chest pain, abdominal pain, severe nausea/vomiting, or problems with urination or bowel movements unless otherwise stated above. ?Pertinent History Reviewed:  ?Reviewed past medical,surgical, social, obstetrical and family history.  ?Reviewed problem list, medications and allergies. ?Physical Assessment:  ? ?Vitals:  ? 05/31/21 1412  ?BP: 128/67  ?Pulse: 87  ?Weight: 193 lb (87.5 kg)  ?Body mass index is 31.15 kg/m?. ?  ?     Physical Examination:  ? General appearance: Well appearing, and in no  distress ? Mental status: Alert, oriented to person, place, and time ? Skin: Warm & dry ? Cardiovascular: Normal heart rate noted ? Respiratory: Normal respiratory effort, no distress ? Abdomen: Soft, gravid, nontender ? Pelvic: Cervical exam deferred        ? Extremities: Edema: None ? ?Fetal Status: Fetal Heart Rate (bpm): 148 u/s   Movement: Absent  [place] 19+6 wks,breech,anterior placenta gr 0,normal ovaries,cx 4.4 cm,SVP of fluid 4.4 cm,FHR 148 bpm,EFW 342 g 67%,anatomy complete,no obvious abnormalities  ?  ? ?Chaperone: N/A   ?No results found for this or any previous visit (from the past 24 hour(s)).  ?Assessment & Plan:  ?1) Low-risk pregnancy G2P0010 at [redacted]w[redacted]d with an Estimated Date of Delivery: 10/19/21  ? ?2) Hypothyroidism, on synthroid daily ?  ?Meds: No orders of the defined types were placed in this encounter. ? ?Labs/procedures today: U/S ? ?Plan:  Continue routine obstetrical care  ?Next visit: prefers in person   ? ?Reviewed: Preterm labor symptoms and general obstetric precautions including but not limited to vaginal bleeding, contractions, leaking of fluid and fetal movement were reviewed in detail with the patient.  All questions were answered. Does have home bp cuff. Office bp cuff given: not applicable. Check  bp weekly, let us know if consistently >140 and/or >90. ? ?Follow-up: Return in about 4 weeks (around 06/28/2021) for LROB, CNM, in person. ? ?No future appointments. ? ? ?No orders of the defined types were placed in this encounter. ? ?Cheral Marker CNM, WHNP-BC ?05/31/2021 ?2:30 PM  ?

## 2021-06-28 ENCOUNTER — Ambulatory Visit (INDEPENDENT_AMBULATORY_CARE_PROVIDER_SITE_OTHER): Payer: Medicaid Other | Admitting: Women's Health

## 2021-06-28 ENCOUNTER — Encounter: Payer: Self-pay | Admitting: Women's Health

## 2021-06-28 VITALS — BP 126/74 | HR 103 | Wt 197.0 lb

## 2021-06-28 DIAGNOSIS — Z3482 Encounter for supervision of other normal pregnancy, second trimester: Secondary | ICD-10-CM

## 2021-06-28 NOTE — Progress Notes (Signed)
LOW-RISK PREGNANCY VISIT Patient name: Melissa Ayers MRN [id number]  Date of birth: 2000/10/10 Chief Complaint:   Routine Prenatal Visit  History of Present Illness:   Melissa Ayers is a 21 y.o. G43P0010 female at [redacted]w[redacted]d with an Estimated Date of Delivery: 10/19/21 being seen today for ongoing management of a low-risk pregnancy.   Today she reports no complaints. Contractions: Not present. Vag. Bleeding: None.  Movement: Present. denies leaking of fluid.     04/12/2021    2:00 PM 11/29/2020   11:13 AM 09/23/2020    2:33 PM  Depression screen PHQ 2/9  Decreased Interest 0 0 0  Down, Depressed, Hopeless 0 0 0  PHQ - 2 Score 0 0 0  Altered sleeping 0 0 0  Tired, decreased energy 0 0 0  Change in appetite 0 0 0  Feeling bad or failure about yourself  0 0 0  Trouble concentrating 0 0 0  Moving slowly or fidgety/restless 0 0 0  Suicidal thoughts 0 0 0  PHQ-9 Score 0 0 0        04/12/2021    2:00 PM 11/29/2020   11:14 AM 09/23/2020    2:33 PM  GAD 7 : Generalized Anxiety Score  Nervous, Anxious, on Edge 0 0 0  Control/stop worrying 0 0 0  Worry too much - different things 0 0 0  Trouble relaxing 0 0 0  Restless 0 0 0  Easily annoyed or irritable 0 0 0  Afraid - awful might happen 0 0   Total GAD 7 Score 0 0       Review of Systems:   Pertinent items are noted in HPI Denies abnormal vaginal discharge w/ itching/odor/irritation, headaches, visual changes, shortness of breath, chest pain, abdominal pain, severe nausea/vomiting, or problems with urination or bowel movements unless otherwise stated above. Pertinent History Reviewed:  Reviewed past medical,surgical, social, obstetrical and family history.  Reviewed problem list, medications and allergies. Physical Assessment:   Vitals:   06/28/21 1351  BP: 126/74  Pulse: (!) 103  Weight: 197 lb (89.4 kg)  Body mass index is 31.8 kg/m.        Physical Examination:   General appearance: Well appearing, and in no  distress  Mental status: Alert, oriented to person, place, and time  Skin: Warm & dry  Cardiovascular: Normal heart rate noted  Respiratory: Normal respiratory effort, no distress  Abdomen: Soft, gravid, nontender  Pelvic: Cervical exam deferred         Extremities: Edema: None  Fetal Status: Fetal Heart Rate (bpm): 150 Fundal Height: 23 cm Movement: Present    Chaperone: N/A   No results found for this or any previous visit (from the past 24 hour(s)).  Assessment & Plan:  1) Low-risk pregnancy G2P0010 at [redacted]w[redacted]d with an Estimated Date of Delivery: 10/19/21   2) Hypothyroidism on synthroid> check TSH next visit   Meds: No orders of the defined types were placed in this encounter.  Labs/procedures today: none  Plan:  Continue routine obstetrical care  Next visit: prefers will be in person for pn2     Reviewed: Preterm labor symptoms and general obstetric precautions including but not limited to vaginal bleeding, contractions, leaking of fluid and fetal movement were reviewed in detail with the patient.  All questions were answered. Does have home bp cuff. Office bp cuff given: not applicable. Check bp weekly, let us know if consistently >140 and/or >90.  Follow-up: Return in about 87  days (around 07/25/2021) for LROB, PN2 w/ TSH, CNM, in person.  Future Appointments  Date Time Provider Department Center  09/13/2021  9:00 AM [place], Harvin Hazel R, PA-C [facility]    No orders of the defined types were placed in this encounter.  Cheral Marker CNM, [facility] 06/28/2021 2:05 PM

## 2021-06-28 NOTE — Patient Instructions (Signed)
Melissa Ayers, thank you for choosing our office today! We appreciate the opportunity to meet your healthcare needs. You may receive a short survey by mail, e-mail, or through [employer]. If you are happy with your care we would appreciate if you could take just a few minutes to complete the survey questions. We read all of your comments and take your feedback very seriously. Thank you again for choosing our office.  Center for [employer] Team at [facility]  [facility] at [facility] ([address] [place], [place] [address]) Entrance C, located off of [address] Free 24/7 valet parking   You will have your sugar test next visit.  Please do not eat or drink anything after midnight the night before you come, not even water.  You will be here for at least two hours.  Please make an appointment online for the bloodwork at [email or web address] for 8:00am (or as close to this as possible). Make sure you select the [facility] service center.   CLASSES: Go to [email or web address] to register for classes (childbirth, breastfeeding, waterbirth, infant CPR, daddy bootcamp, etc.)  Call the office [phone]) or go to [facility] if: You begin to have strong, frequent contractions Your water breaks.  Sometimes it is a big gush of fluid, sometimes it is just a trickle that keeps getting your panties wet or running down your legs You have vaginal bleeding.  It is normal to have a small amount of spotting if your cervix was checked.  You don't feel your baby moving like normal.  If you don't, get you something to eat and drink and lay down and focus on feeling your baby move.   If your baby is still not moving like normal, you should call the office or go to [facility].  Call the office [phone]) or go to [facility] hospital for these signs of pre-eclampsia: Severe headache that does not go away with Tylenol Visual changes- seeing spots, double, blurred vision Pain under your right breast or upper  abdomen that does not go away with Tums or heartburn medicine Nausea and/or vomiting Severe swelling in your hands, feet, and face    [facility] Pediatricians/Family Doctors [facility]): [address] Colette Ribas, [phone]           [facility]: [address], [phone]                [facility]): [address], [phone] (call to ask if accepting patients) [facility] Department: [address], [place], [phone]    [facility] Pediatricians/Family Doctors [facility]): [address], [phone] [facility]: [address] [place], [phone] [facility]: [address], [phone]  [facility] Doctors  [facility]): [phone] [facility]: [address] [phone]   [facility] Doctors [facility]: [address] [phone]  [facility] Doctors  [facility]: [phone], [phone]  Home Blood Pressure Monitoring for Patients   Your provider has recommended that you check your blood pressure (BP) at least once a week at home. If you do not have a blood pressure cuff at home, one will be provided for you. Contact your provider if you have not received your monitor within 1 week.   Helpful Tips for Accurate Home Blood Pressure Checks  Don't smoke, exercise, or drink  caffeine 30 minutes before checking your BP Use the restroom before checking your BP (a full bladder can raise your pressure) Relax in a comfortable upright chair Feet on the ground Left arm resting comfortably on a flat surface at the level of your heart Legs uncrossed Back supported Sit quietly and don't talk Place the cuff on your bare arm Adjust snuggly, so that only two fingertips can fit between your skin and the top of the cuff Check 2  readings separated by at least one minute Keep a log of your BP readings For a visual, please reference this diagram: http://ccnc.care/bpdiagram  Provider Name: [facility]     Phone: [phone]  Zone 1: ALL CLEAR  Continue to monitor your symptoms:  BP reading is less than 140 (top number) or less than 90 (bottom number)  No right upper stomach pain No headaches or seeing spots No feeling nauseated or throwing up No swelling in face and hands  Zone 2: CAUTION Call your doctor's office for any of the following:  BP reading is greater than 140 (top number) or greater than 90 (bottom number)  Stomach pain under your ribs in the middle or right side Headaches or seeing spots Feeling nauseated or throwing up Swelling in face and hands  Zone 3: EMERGENCY  Seek immediate medical care if you have any of the following:  BP reading is greater than160 (top number) or greater than 110 (bottom number) Severe headaches not improving with Tylenol Serious difficulty catching your breath Any worsening symptoms from Zone 2   Second Trimester of Pregnancy The second trimester is from week 13 through week 28, months 4 through 6. The second trimester is often a time when you feel your best. Your body has also adjusted to being pregnant, and you begin to feel better physically. Usually, morning sickness has lessened or quit completely, you may have more energy, and you may have an increase in appetite. The second trimester is also a time when the fetus is growing rapidly. At the end of the sixth month, the fetus is about 9 inches long and weighs about 1 pounds. You will likely begin to feel the baby move (quickening) between 18 and 20 weeks of the pregnancy. BODY CHANGES Your body goes through many changes during pregnancy. The changes vary from woman to woman.  Your weight will continue to increase. You will notice your lower abdomen bulging out. You may begin to get stretch marks on your  hips, abdomen, and breasts. You may develop headaches that can be relieved by medicines approved by your health care provider. You may urinate more often because the fetus is pressing on your bladder. You may develop or continue to have heartburn as a result of your pregnancy. You may develop constipation because certain hormones are causing the muscles that push waste through your intestines to slow down. You may develop hemorrhoids or swollen, bulging veins (varicose veins). You may have back pain because of the weight gain and pregnancy hormones relaxing your joints between the bones in your pelvis and as a result of a shift in weight and the muscles that support your balance. Your breasts will continue to grow and be tender. Your gums may bleed and may be sensitive to brushing and flossing. Dark spots or blotches (chloasma, mask of pregnancy) may develop on your face. This will likely fade after the baby is born. A dark line from your belly button to the pubic area (linea nigra) may appear. This  will likely fade after the baby is born. You may have changes in your hair. These can include thickening of your hair, rapid growth, and changes in texture. Some women also have hair loss during or after pregnancy, or hair that feels dry or thin. Your hair will most likely return to normal after your baby is born. WHAT TO EXPECT AT YOUR PRENATAL VISITS During a routine prenatal visit: You will be weighed to make sure you and the fetus are growing normally. Your blood pressure will be taken. Your abdomen will be measured to track your baby's growth. The fetal heartbeat will be listened to. Any test results from the previous visit will be discussed. Your health care provider may ask you: How you are feeling. If you are feeling the baby move. If you have had any abnormal symptoms, such as leaking fluid, bleeding, severe headaches, or abdominal cramping. If you have any questions. Other tests that may  be performed during your second trimester include: Blood tests that check for: Low iron levels (anemia). Gestational diabetes (between 24 and 28 weeks). Rh antibodies. Urine tests to check for infections, diabetes, or protein in the urine. An ultrasound to confirm the proper growth and development of the baby. An amniocentesis to check for possible genetic problems. Fetal screens for spina bifida and Down syndrome. HOME CARE INSTRUCTIONS  Avoid all smoking, herbs, alcohol, and unprescribed drugs. These chemicals affect the formation and growth of the baby. Follow your health care provider's instructions regarding medicine use. There are medicines that are either safe or unsafe to take during pregnancy. Exercise only as directed by your health care provider. Experiencing uterine cramps is a good sign to stop exercising. Continue to eat regular, healthy meals. Wear a good support bra for breast tenderness. Do not use hot tubs, steam rooms, or saunas. Wear your seat belt at all times when driving. Avoid raw meat, uncooked cheese, cat litter boxes, and soil used by cats. These carry germs that can cause birth defects in the baby. Take your prenatal vitamins. Try taking a stool softener (if your health care provider approves) if you develop constipation. Eat more high-fiber foods, such as fresh vegetables or fruit and whole grains. Drink plenty of fluids to keep your urine clear or pale yellow. Take warm sitz baths to soothe any pain or discomfort caused by hemorrhoids. Use hemorrhoid cream if your health care provider approves. If you develop varicose veins, wear support hose. Elevate your feet for 15 minutes, 3-4 times a day. Limit salt in your diet. Avoid heavy lifting, wear low heel shoes, and practice good posture. Rest with your legs elevated if you have leg cramps or low back pain. Visit your dentist if you have not gone yet during your pregnancy. Use a soft toothbrush to brush your teeth  and be gentle when you floss. A sexual relationship may be continued unless your health care provider directs you otherwise. Continue to go to all your prenatal visits as directed by your health care provider. SEEK MEDICAL CARE IF:  You have dizziness. You have mild pelvic cramps, pelvic pressure, or nagging pain in the abdominal area. You have persistent nausea, vomiting, or diarrhea. You have a bad smelling vaginal discharge. You have pain with urination. SEEK IMMEDIATE MEDICAL CARE IF:  You have a fever. You are leaking fluid from your vagina. You have spotting or bleeding from your vagina. You have severe abdominal cramping or pain. You have rapid weight gain or loss. You have shortness of   breath with chest pain. You notice sudden or extreme swelling of your face, hands, ankles, feet, or legs. You have not felt your baby move in over an hour. You have severe headaches that do not go away with medicine. You have vision changes. Document Released: 01/03/2001 Document Revised: 01/14/2013 Document Reviewed: 03/12/2012 [facility] Patient Information 2015 [place], [place]. This information is not intended to replace advice given to you by your health care provider. Make sure you discuss any questions you have with your health care provider.

## 2021-07-28 ENCOUNTER — Ambulatory Visit (INDEPENDENT_AMBULATORY_CARE_PROVIDER_SITE_OTHER): Payer: Medicaid Other | Admitting: Advanced Practice Midwife

## 2021-07-28 ENCOUNTER — Other Ambulatory Visit: Payer: Medicaid Other

## 2021-07-28 ENCOUNTER — Encounter: Payer: Self-pay | Admitting: Advanced Practice Midwife

## 2021-07-28 VITALS — BP 125/71 | HR 96 | Wt 203.0 lb

## 2021-07-28 DIAGNOSIS — Z3A28 28 weeks gestation of pregnancy: Secondary | ICD-10-CM

## 2021-07-28 DIAGNOSIS — E039 Hypothyroidism, unspecified: Secondary | ICD-10-CM

## 2021-07-28 DIAGNOSIS — Z131 Encounter for screening for diabetes mellitus: Secondary | ICD-10-CM

## 2021-07-28 DIAGNOSIS — Z3480 Encounter for supervision of other normal pregnancy, unspecified trimester: Secondary | ICD-10-CM

## 2021-07-28 NOTE — Progress Notes (Signed)
   LOW-RISK PREGNANCY VISIT Patient name: Melissa Ayers MRN [id number]  Date of birth: October 27, 2000 Chief Complaint:   Routine Prenatal Visit and Low Risk Gestation (PN2)  History of Present Illness:   Melissa Ayers is a 21 y.o. G84P0010 female at [redacted]w[redacted]d with an Estimated Date of Delivery: 10/19/21 being seen today for ongoing management of a low-risk pregnancy.  Today she reports no complaints. Contractions: Not present.  .  Movement: Present. denies leaking of fluid. Review of Systems:   Pertinent items are noted in HPI Denies abnormal vaginal discharge w/ itching/odor/irritation, headaches, visual changes, shortness of breath, chest pain, abdominal pain, severe nausea/vomiting, or problems with urination or bowel movements unless otherwise stated above. Pertinent History Reviewed:  Reviewed past medical,surgical, social, obstetrical and family history.  Reviewed problem list, medications and allergies. Physical Assessment:   Vitals:   07/28/21 1001  BP: 125/71  Pulse: 96  Weight: 203 lb (92.1 kg)  Body mass index is 32.77 kg/m.        Physical Examination:   General appearance: Well appearing, and in no distress  Mental status: Alert, oriented to person, place, and time  Skin: Warm & dry  Cardiovascular: Normal heart rate noted  Respiratory: Normal respiratory effort, no distress  Abdomen: Soft, gravid, nontender  Pelvic: Cervical exam deferred         Extremities: Edema: None  Fetal Status:     Movement: Present    Chaperone: n/a    No results found for this or any previous visit (from the past 24 hour(s)).  Assessment & Plan:  1) Low-risk pregnancy G2P0010 at [redacted]w[redacted]d with an Estimated Date of Delivery: 10/19/21   2) Hypothyroid, check TSH today   Meds: No orders of the defined types were placed in this encounter.  Labs/procedures today: PN2  Plan:  Continue routine obstetrical care  Next visit: prefers in person    Reviewed: Preterm labor symptoms and general obstetric  precautions including but not limited to vaginal bleeding, contractions, leaking of fluid and fetal movement were reviewed in detail with the patient.  All questions were answered. Has home bp cuff. Check bp weekly, let us know if >140/90.   Follow-up: No follow-ups on file.  Orders Placed This Encounter  Procedures   TSH   Jacklyn Shell DNP, CNM 07/28/2021 10:25 AM

## 2021-07-28 NOTE — Patient Instructions (Signed)
Melissa Ayers, I greatly value your feedback.  If you receive a survey following your visit with [place] today, we appreciate you taking the time to fill it out.  Thanks, Melissa Ayers, CNM   Melissa [facility] HAS MOVED!!! It is now Melissa [facility] ([address] [place], [place] [address]) Entrance located off of [address] Free 24/7 valet parking   Go to [email or web address] to register for FREE online childbirth classes    Call the office [phone]) or go to [facility] if: You begin to have strong, frequent contractions Your water breaks.  Sometimes it is a big gush of fluid, sometimes it is just a trickle that keeps getting your panties wet or running down your legs You have vaginal bleeding.  It is normal to have a small amount of spotting if your cervix was checked.  You don't feel your baby moving like normal.  If you don't, get you something to eat and drink and lay down and focus on feeling your baby move.  You should feel at least 10 movements in 2 hours.  If you don't, you should call the office or go to [facility].    Tdap Vaccine It is recommended that you get the Tdap vaccine during the third trimester of EACH pregnancy to help protect your baby from getting pertussis (whooping cough) 27-36 weeks is the BEST time to do this so that you can pass the protection on to your baby. During pregnancy is better than after pregnancy, but if you are unable to get it during pregnancy it will be offered at the [facility].  You will be offered this vaccine in the office after 27 weeks. If you do not have health insurance, you can get this vaccine at the health department or your family doctor Everyone who will be around your baby should also be up-to-date on their vaccines. Adults (who are not pregnant) only need 1 dose of Tdap during adulthood.   Third Trimester of Pregnancy The third trimester is from week 29 through week 42, months 7 through 9. The third trimester  is a time when the fetus is growing rapidly. At the end of the ninth month, the fetus is about 20 inches in length and weighs 6-10 pounds.  BODY CHANGES Your body goes through many changes during pregnancy. The changes vary from woman to woman.  Your weight will continue to increase. You can expect to gain 25-35 pounds (11-16 kg) by the end of the pregnancy. You may begin to get stretch marks on your hips, abdomen, and breasts. You may urinate more often because the fetus is moving lower into your pelvis and pressing on your bladder. You may develop or continue to have heartburn as a result of your pregnancy. You may develop constipation because certain hormones are causing the muscles that push waste through your intestines to slow down. You may develop hemorrhoids or swollen, bulging veins (varicose veins). You may have pelvic pain because of the weight gain and pregnancy hormones relaxing your joints between the bones in your pelvis. Backaches may result from overexertion of the muscles supporting your posture. You may have changes in your hair. These can include thickening of your hair, rapid growth, and changes in texture. Some women also have hair loss during or after pregnancy, or hair that feels dry or thin. Your hair will most likely return to normal after your baby is born. Your breasts will continue to grow and be tender. A  yellow discharge may leak from your breasts called colostrum. Your belly button may stick out. You may feel short of breath because of your expanding uterus. You may notice the fetus "dropping," or moving lower in your abdomen. You may have a bloody mucus discharge. This usually occurs a few days to a week before labor begins. Your cervix becomes thin and soft (effaced) near your due date. WHAT TO EXPECT AT YOUR PRENATAL EXAMS  You will have prenatal exams every 2 weeks until week 36. Then, you will have weekly prenatal exams. During a routine prenatal visit: You  will be weighed to make sure you and the fetus are growing normally. Your blood pressure is taken. Your abdomen will be measured to track your baby's growth. The fetal heartbeat will be listened to. Any test results from the previous visit will be discussed. You may have a cervical check near your due date to see if you have effaced. At around 36 weeks, your caregiver will check your cervix. At the same time, your caregiver will also perform a test on the secretions of the vaginal tissue. This test is to determine if a type of bacteria, Group B streptococcus, is present. Your caregiver will explain this further. Your caregiver may ask you: What your birth plan is. How you are feeling. If you are feeling the baby move. If you have had any abnormal symptoms, such as leaking fluid, bleeding, severe headaches, or abdominal cramping. If you have any questions. Other tests or screenings that may be performed during your third trimester include: Blood tests that check for low iron levels (anemia). Fetal testing to check the health, activity level, and growth of the fetus. Testing is done if you have certain medical conditions or if there are problems during the pregnancy. FALSE LABOR You may feel small, irregular contractions that eventually go away. These are called Braxton Hicks contractions, or false labor. Contractions may last for hours, days, or even weeks before true labor sets in. If contractions come at regular intervals, intensify, or become painful, it is best to be seen by your caregiver.  SIGNS OF LABOR  Menstrual-like cramps. Contractions that are 5 minutes apart or less. Contractions that start on the top of the uterus and spread down to the lower abdomen and back. A sense of increased pelvic pressure or back pain. A watery or bloody mucus discharge that comes from the vagina. If you have any of these signs before the 37th week of pregnancy, call your caregiver right away. You need to  go to the [facility] to get checked immediately. HOME CARE INSTRUCTIONS  Avoid all smoking, herbs, alcohol, and unprescribed drugs. These chemicals affect the formation and growth of the baby. Follow your caregiver's instructions regarding medicine use. There are medicines that are either safe or unsafe to take during pregnancy. Exercise only as directed by your caregiver. Experiencing uterine cramps is a good sign to stop exercising. Continue to eat regular, healthy meals. Wear a good support bra for breast tenderness. Do not use hot tubs, steam rooms, or saunas. Wear your seat belt at all times when driving. Avoid raw meat, uncooked cheese, cat litter boxes, and soil used by cats. These carry germs that can cause birth defects in the baby. Take your prenatal vitamins. Try taking a stool softener (if your caregiver approves) if you develop constipation. Eat more high-fiber foods, such as fresh vegetables or fruit and whole grains. Drink plenty of fluids to keep your urine clear or pale yellow.  Take warm sitz baths to soothe any pain or discomfort caused by hemorrhoids. Use hemorrhoid cream if your caregiver approves. If you develop varicose veins, wear support hose. Elevate your feet for 15 minutes, 3-4 times a day. Limit salt in your diet. Avoid heavy lifting, wear low heal shoes, and practice good posture. Rest a lot with your legs elevated if you have leg cramps or low back pain. Visit your dentist if you have not gone during your pregnancy. Use a soft toothbrush to brush your teeth and be gentle when you floss. A sexual relationship may be continued unless your caregiver directs you otherwise. Do not travel far distances unless it is absolutely necessary and only with the approval of your caregiver. Take prenatal classes to understand, practice, and ask questions about the labor and delivery. Make a trial run to the [facility]. Pack your [facility] bag. Prepare the baby's nursery. Continue to  go to all your prenatal visits as directed by your caregiver. SEEK MEDICAL CARE IF: You are unsure if you are in labor or if your water has broken. You have dizziness. You have mild pelvic cramps, pelvic pressure, or nagging pain in your abdominal area. You have persistent nausea, vomiting, or diarrhea. You have a bad smelling vaginal discharge. You have pain with urination. SEEK IMMEDIATE MEDICAL CARE IF:  You have a fever. You are leaking fluid from your vagina. You have spotting or bleeding from your vagina. You have severe abdominal cramping or pain. You have rapid weight loss or gain. You have shortness of breath with chest pain. You notice sudden or extreme swelling of your face, hands, ankles, feet, or legs. You have not felt your baby move in over an hour. You have severe headaches that do not go away with medicine. You have vision changes. Document Released: 01/03/2001 Document Revised: 01/14/2013 Document Reviewed: 03/12/2012 [facility] [facility] Patient Information 2015 [place], [place]. This information is not intended to replace advice given to you by your health care provider. Make sure you discuss any questions you have with your health care provider.

## 2021-07-29 LAB — CBC
Hematocrit: 33.9 % — ABNORMAL LOW (ref 34.0–46.6)
Hemoglobin: 11.3 g/dL (ref 11.1–15.9)
MCH: 30.1 pg (ref 26.6–33.0)
MCHC: 33.3 g/dL (ref 31.5–35.7)
MCV: 90 fL (ref 79–97)
Platelets: 223 10*3/uL (ref 150–450)
RBC: 3.75 x10E6/uL — ABNORMAL LOW (ref 3.77–5.28)
RDW: 12.6 % (ref 11.7–15.4)
WBC: 10.6 10*3/uL (ref 3.4–10.8)

## 2021-07-29 LAB — GLUCOSE TOLERANCE, 2 HOURS W/ 1HR
Glucose, 1 hour: 117 mg/dL (ref 70–179)
Glucose, 2 hour: 81 mg/dL (ref 70–152)
Glucose, Fasting: 88 mg/dL (ref 70–91)

## 2021-07-29 LAB — ANTIBODY SCREEN: Antibody Screen: NEGATIVE

## 2021-07-29 LAB — RPR: RPR Ser Ql: NONREACTIVE

## 2021-07-29 LAB — TSH: TSH: 3.8 u[IU]/mL (ref 0.450–4.500)

## 2021-07-29 LAB — HIV ANTIBODY (ROUTINE TESTING W REFLEX): HIV Screen 4th Generation wRfx: NONREACTIVE

## 2021-08-15 ENCOUNTER — Encounter: Payer: Self-pay | Admitting: Women's Health

## 2021-08-16 ENCOUNTER — Encounter: Payer: Self-pay | Admitting: Women's Health

## 2021-08-16 ENCOUNTER — Ambulatory Visit (INDEPENDENT_AMBULATORY_CARE_PROVIDER_SITE_OTHER): Payer: Medicaid Other | Admitting: Women's Health

## 2021-08-16 VITALS — BP 127/86 | HR 94 | Wt 205.0 lb

## 2021-08-16 DIAGNOSIS — Z3480 Encounter for supervision of other normal pregnancy, unspecified trimester: Secondary | ICD-10-CM

## 2021-08-16 DIAGNOSIS — Z3483 Encounter for supervision of other normal pregnancy, third trimester: Secondary | ICD-10-CM

## 2021-08-16 NOTE — Progress Notes (Signed)
LOW-RISK PREGNANCY VISIT Patient name: Melissa Ayers MRN [id number]  Date of birth: 12-Mar-2000 Chief Complaint:   Routine Prenatal Visit  History of Present Illness:   Melissa Ayers is a 21 y.o. G59P0010 female at [redacted]w[redacted]d with an Estimated Date of Delivery: 10/19/21 being seen today for ongoing management of a low-risk pregnancy.   Today she reports no complaints. Contractions: Not present. Vag. Bleeding: None.  Movement: Present. denies leaking of fluid.     07/28/2021   10:12 AM 04/12/2021    2:00 PM 11/29/2020   11:13 AM 09/23/2020    2:33 PM  Depression screen PHQ 2/9  Decreased Interest 0 0 0 0  Down, Depressed, Hopeless 0 0 0 0  PHQ - 2 Score 0 0 0 0  Altered sleeping 0 0 0 0  Tired, decreased energy 0 0 0 0  Change in appetite 0 0 0 0  Feeling bad or failure about yourself  0 0 0 0  Trouble concentrating 0 0 0 0  Moving slowly or fidgety/restless 0 0 0 0  Suicidal thoughts 0 0 0 0  PHQ-9 Score 0 0 0 0        07/28/2021   10:12 AM 04/12/2021    2:00 PM 11/29/2020   11:14 AM 09/23/2020    2:33 PM  GAD 7 : Generalized Anxiety Score  Nervous, Anxious, on Edge 0 0 0 0  Control/stop worrying 0 0 0 0  Worry too much - different things 0 0 0 0  Trouble relaxing 0 0 0 0  Restless 0 0 0 0  Easily annoyed or irritable 0 0 0 0  Afraid - awful might happen 0 0 0   Total GAD 7 Score 0 0 0       Review of Systems:   Pertinent items are noted in HPI Denies abnormal vaginal discharge w/ itching/odor/irritation, headaches, visual changes, shortness of breath, chest pain, abdominal pain, severe nausea/vomiting, or problems with urination or bowel movements unless otherwise stated above. Pertinent History Reviewed:  Reviewed past medical,surgical, social, obstetrical and family history.  Reviewed problem list, medications and allergies. Physical Assessment:   Vitals:   08/16/21 1406  BP: 127/86  Pulse: 94  Weight: 205 lb (93 kg)  Body mass index is 33.09 kg/m.        Physical  Examination:   General appearance: Well appearing, and in no distress  Mental status: Alert, oriented to person, place, and time  Skin: Warm & dry  Cardiovascular: Normal heart rate noted  Respiratory: Normal respiratory effort, no distress  Abdomen: Soft, gravid, nontender  Pelvic: Cervical exam deferred         Extremities: Edema: None  Fetal Status: Fetal Heart Rate (bpm): 145 Fundal Height: 30 cm Movement: Present    Chaperone: N/A   No results found for this or any previous visit (from the past 24 hour(s)).  Assessment & Plan:  1) Low-risk pregnancy G2P0010 at [redacted]w[redacted]d with an Estimated Date of Delivery: 10/19/21   2) Hypothyroid, on synthroid , last TSH 3.8 on 7/6   Meds: No orders of the defined types were placed in this encounter.  Labs/procedures today: none  Plan:  Continue routine obstetrical care  Next visit: prefers in person    Reviewed: Preterm labor symptoms and general obstetric precautions including but not limited to vaginal bleeding, contractions, leaking of fluid and fetal movement were reviewed in detail with the patient.  All questions were answered. Does have home  bp cuff. Office bp cuff given: not applicable. Check bp weekly, let us know if consistently >140 and/or >90.  Follow-up: Return in about 2 weeks (around 08/30/2021) for LROB, CNM, in person.  Future Appointments  Date Time Provider Department Center  09/13/2021  9:00 AM [place], Harvin Hazel R, PA-C [facility]    No orders of the defined types were placed in this encounter.  Cheral Marker CNM, [facility] 08/16/2021 2:18 PM

## 2021-08-16 NOTE — Patient Instructions (Signed)
Melissa Ayers, thank you for choosing our office today! We appreciate the opportunity to meet your healthcare needs. You may receive a short survey by mail, e-mail, or through [employer]. If you are happy with your care we would appreciate if you could take just a few minutes to complete the survey questions. We read all of your comments and take your feedback very seriously. Thank you again for choosing our office.  Center for [employer] Team at [facility]  [facility] ([address] [place], [place] [address]) Entrance C, located off of [address] Free 24/7 valet parking   CLASSES: Go to [email or web address] to register for classes (childbirth, breastfeeding, waterbirth, infant CPR, daddy bootcamp, etc.)  Call the office [phone]) or go to [facility] if: You begin to have strong, frequent contractions Your water breaks.  Sometimes it is a big gush of fluid, sometimes it is just a trickle that keeps getting your panties wet or running down your legs You have vaginal bleeding.  It is normal to have a small amount of spotting if your cervix was checked.  You don't feel your baby moving like normal.  If you don't, get you something to eat and drink and lay down and focus on feeling your baby move.   If your baby is still not moving like normal, you should call the office or go to [facility].  Call the office [phone]) or go to [facility] for these signs of pre-eclampsia: Severe headache that does not go away with Tylenol Visual changes- seeing spots, double, blurred vision Pain under your right breast or upper abdomen that does not go away with Tums or heartburn medicine Nausea and/or vomiting Severe swelling in your hands, feet, and face   Tdap Vaccine It is recommended that you get the Tdap vaccine during the third trimester of EACH pregnancy to help protect your baby from getting pertussis (whooping cough) 27-36 weeks is the BEST time to do  this so that you can pass the protection on to your baby. During pregnancy is better than after pregnancy, but if you are unable to get it during pregnancy it will be offered at the hospital.  You can get this vaccine with [place], at the health department, your family doctor, or some local pharmacies Everyone who will be around your baby should also be up-to-date on their vaccines before the baby comes. Adults (who are not pregnant) only need 1 dose of Tdap during adulthood.   [facility] Pediatricians/Family Doctors [facility]): [address] Colette Ribas, [phone]           [facility]: [address], [phone]                [facility] Medicine [facility]): [address], [phone] (call to ask if accepting patients) [facility]: [address], [place], [phone]    [facility] Pediatricians/Family Doctors [facility]): [phone] [address], [phone] [facility]: [address] [place], [phone] [facility]: [address], [phone]  [facility] Doctors  [facility]): [phone] [facility]: [address], [phone]   [facility] Doctors [facility]: [address], [phone]  [facility] Family Doctors  [facility]: [phone], [phone]  Home Blood Pressure Monitoring for Patients   Your provider has recommended that you check your  blood pressure (BP) at least once a week at home. If you do not have a blood pressure cuff at home, one will be provided for you. Contact your provider if you have not received your monitor within 1 week.   Helpful Tips for Accurate Home Blood Pressure Checks  Don't smoke, exercise, or drink caffeine 30 minutes before checking your BP Use the restroom before checking your BP (a full bladder can raise your  pressure) Relax in a comfortable upright chair Feet on the ground Left arm resting comfortably on a flat surface at the level of your heart Legs uncrossed Back supported Sit quietly and don't talk Place the cuff on your bare arm Adjust snuggly, so that only two fingertips can fit between your skin and the top of the cuff Check 2 readings separated by at least one minute Keep a log of your BP readings For a visual, please reference this diagram: http://ccnc.care/bpdiagram  Provider Name: [facility]     Phone: [phone]  Zone 1: ALL CLEAR  Continue to monitor your symptoms:  BP reading is less than 140 (top number) or less than 90 (bottom number)  No right upper stomach pain No headaches or seeing spots No feeling nauseated or throwing up No swelling in face and hands  Zone 2: CAUTION Call your doctor's office for any of the following:  BP reading is greater than 140 (top number) or greater than 90 (bottom number)  Stomach pain under your ribs in the middle or right side Headaches or seeing spots Feeling nauseated or throwing up Swelling in face and hands  Zone 3: EMERGENCY  Seek immediate medical care if you have any of the following:  BP reading is greater than160 (top number) or greater than 110 (bottom number) Severe headaches not improving with Tylenol Serious difficulty catching your breath Any worsening symptoms from Zone 2  Preterm Labor and Birth Information  The normal length of a pregnancy is 39-41 weeks. Preterm labor is when labor starts before 37 completed weeks of pregnancy. What are the risk factors for preterm labor? Preterm labor is more likely to occur in women who: Have certain infections during pregnancy such as a bladder infection, sexually transmitted infection, or infection inside the uterus (chorioamnionitis). Have a shorter-than-normal cervix. Have gone into preterm labor before. Have had surgery on their cervix. Are younger than age 17  or older than age 35. Are African American. Are pregnant with twins or multiple babies (multiple gestation). Take street drugs or smoke while pregnant. Do not gain enough weight while pregnant. Became pregnant shortly after having been pregnant. What are the symptoms of preterm labor? Symptoms of preterm labor include: Cramps similar to those that can happen during a menstrual period. The cramps may happen with diarrhea. Pain in the abdomen or lower back. Regular uterine contractions that may feel like tightening of the abdomen. A feeling of increased pressure in the pelvis. Increased watery or bloody mucus discharge from the vagina. Water breaking (ruptured amniotic sac). Why is it important to recognize signs of preterm labor? It is important to recognize signs of preterm labor because babies who are born prematurely may not be fully developed. This can put them at an increased risk for: Long-term (chronic) heart and lung problems. Difficulty immediately after birth with regulating body systems, including blood sugar, body temperature, heart rate, and breathing rate. Bleeding in the brain. Cerebral palsy. Learning difficulties. Death. These risks are highest for babies who are born before 34 weeks   of pregnancy. How is preterm labor treated? Treatment depends on the length of your pregnancy, your condition, and the health of your baby. It may involve: Having a stitch (suture) placed in your cervix to prevent your cervix from opening too early (cerclage). Taking or being given medicines, such as: Hormone medicines. These may be given early in pregnancy to help support the pregnancy. Medicine to stop contractions. Medicines to help mature the baby's lungs. These may be prescribed if the risk of delivery is high. Medicines to prevent your baby from developing cerebral palsy. If the labor happens before 34 weeks of pregnancy, you may need to stay in the hospital. What should I do if I  think I am in preterm labor? If you think that you are going into preterm labor, call your health care provider right away. How can I prevent preterm labor in future pregnancies? To increase your chance of having a full-term pregnancy: Do not use any tobacco products, such as cigarettes, chewing tobacco, and e-cigarettes. If you need help quitting, ask your health care provider. Do not use street drugs or medicines that have not been prescribed to you during your pregnancy. Talk with your health care provider before taking any herbal supplements, even if you have been taking them regularly. Make sure you gain a healthy amount of weight during your pregnancy. Watch for infection. If you think that you might have an infection, get it checked right away. Make sure to tell your health care provider if you have gone into preterm labor before. This information is not intended to replace advice given to you by your health care provider. Make sure you discuss any questions you have with your health care provider. Document Revised: 05/03/2018 Document Reviewed: 06/02/2015 Elsevier Patient Education  2020 [employer]   

## 2021-08-30 ENCOUNTER — Ambulatory Visit (INDEPENDENT_AMBULATORY_CARE_PROVIDER_SITE_OTHER): Payer: Medicaid Other | Admitting: Women's Health

## 2021-08-30 ENCOUNTER — Encounter: Payer: Self-pay | Admitting: Women's Health

## 2021-08-30 VITALS — BP 114/74 | HR 89 | Wt 206.4 lb

## 2021-08-30 DIAGNOSIS — Z3483 Encounter for supervision of other normal pregnancy, third trimester: Secondary | ICD-10-CM

## 2021-08-30 DIAGNOSIS — Z3A32 32 weeks gestation of pregnancy: Secondary | ICD-10-CM

## 2021-08-30 DIAGNOSIS — Z3480 Encounter for supervision of other normal pregnancy, unspecified trimester: Secondary | ICD-10-CM

## 2021-08-30 NOTE — Progress Notes (Signed)
LOW-RISK PREGNANCY VISIT Patient name: Melissa Ayers MRN [id number]  Date of birth: 2000-09-07 Chief Complaint:   Routine Prenatal Visit  History of Present Illness:   Melissa Ayers is a 21 y.o. G21P0010 female at [redacted]w[redacted]d with an Estimated Date of Delivery: 10/19/21 being seen today for ongoing management of a low-risk pregnancy.   Today she reports no complaints. Contractions: Not present.  .  Movement: Present. denies leaking of fluid.     07/28/2021   10:12 AM 04/12/2021    2:00 PM 11/29/2020   11:13 AM 09/23/2020    2:33 PM  Depression screen PHQ 2/9  Decreased Interest 0 0 0 0  Down, Depressed, Hopeless 0 0 0 0  PHQ - 2 Score 0 0 0 0  Altered sleeping 0 0 0 0  Tired, decreased energy 0 0 0 0  Change in appetite 0 0 0 0  Feeling bad or failure about yourself  0 0 0 0  Trouble concentrating 0 0 0 0  Moving slowly or fidgety/restless 0 0 0 0  Suicidal thoughts 0 0 0 0  PHQ-9 Score 0 0 0 0        07/28/2021   10:12 AM 04/12/2021    2:00 PM 11/29/2020   11:14 AM 09/23/2020    2:33 PM  GAD 7 : Generalized Anxiety Score  Nervous, Anxious, on Edge 0 0 0 0  Control/stop worrying 0 0 0 0  Worry too much - different things 0 0 0 0  Trouble relaxing 0 0 0 0  Restless 0 0 0 0  Easily annoyed or irritable 0 0 0 0  Afraid - awful might happen 0 0 0   Total GAD 7 Score 0 0 0       Review of Systems:   Pertinent items are noted in HPI Denies abnormal vaginal discharge w/ itching/odor/irritation, headaches, visual changes, shortness of breath, chest pain, abdominal pain, severe nausea/vomiting, or problems with urination or bowel movements unless otherwise stated above. Pertinent History Reviewed:  Reviewed past medical,surgical, social, obstetrical and family history.  Reviewed problem list, medications and allergies. Physical Assessment:   Vitals:   08/30/21 1054  BP: 114/74  Pulse: 89  Weight: 206 lb 6.4 oz (93.6 kg)  Body mass index is 33.31 kg/m.        Physical  Examination:   General appearance: Well appearing, and in no distress  Mental status: Alert, oriented to person, place, and time  Skin: Warm & dry  Cardiovascular: Normal heart rate noted  Respiratory: Normal respiratory effort, no distress  Abdomen: Soft, gravid, nontender  Pelvic: Cervical exam deferred         Extremities: Edema: None  Fetal Status: Fetal Heart Rate (bpm): 135 Fundal Height: 32 cm Movement: Present    Chaperone: N/A   No results found for this or any previous visit (from the past 24 hour(s)).  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  Labs/procedures today: none  Plan:  Continue routine obstetrical care  Next visit: prefers in person    Reviewed: Preterm labor symptoms and general obstetric precautions including but not limited to vaginal bleeding, contractions, leaking of fluid and fetal movement were reviewed in detail with the patient.  All questions were answered. Does have home  bp cuff. Office bp cuff given: not applicable. Check bp weekly, let us know if consistently >140 and/or >90.  Follow-up: Return in about 2 weeks (around 09/13/2021) for LROB, CNM, in person.  No future appointments.  No orders of the defined types were placed in this encounter.  Cheral Marker CNM, [facility] 08/30/2021 11:10 AM

## 2021-08-30 NOTE — Patient Instructions (Addendum)
Melissa Ayers, thank you for choosing our office today! We appreciate the opportunity to meet your healthcare needs. You may receive a short survey by mail, e-mail, or through [employer]. If you are happy with your care we would appreciate if you could take just a few minutes to complete the survey questions. We read all of your comments and take your feedback very seriously. Thank you again for choosing our office.  Center for [employer] Team at [facility]  [facility] at [facility] ([address] [place], [place] [address]) Entrance C, located off of [address] Free 24/7 valet parking   Increase your medicine to twice daily on 8/16  CLASSES: Go to [email or web address] to register for classes (childbirth, breastfeeding, waterbirth, infant CPR, daddy bootcamp, etc.)  Call the office [phone]) or go to [facility] if: You begin to have strong, frequent contractions Your water breaks.  Sometimes it is a big gush of fluid, sometimes it is just a trickle that keeps getting your panties wet or running down your legs You have vaginal bleeding.  It is normal to have a small amount of spotting if your cervix was checked.  You don't feel your baby moving like normal.  If you don't, get you something to eat and drink and lay down and focus on feeling your baby move.   If your baby is still not moving like normal, you should call the office or go to [facility].  Call the office [phone]) or go to [facility] hospital for these signs of pre-eclampsia: Severe headache that does not go away with Tylenol Visual changes- seeing spots, double, blurred vision Pain under your right breast or upper abdomen that does not go away with Tums or heartburn medicine Nausea and/or vomiting Severe swelling in your hands, feet, and face   Tdap Vaccine It is recommended that you get the Tdap vaccine during the third trimester of EACH pregnancy to help protect your baby from getting pertussis  (whooping cough) 27-36 weeks is the BEST time to do this so that you can pass the protection on to your baby. During pregnancy is better than after pregnancy, but if you are unable to get it during pregnancy it will be offered at the hospital.  You can get this vaccine with [place], at the health department, your family doctor, or some local pharmacies Everyone who will be around your baby should also be up-to-date on their vaccines before the baby comes. Adults (who are not pregnant) only need 1 dose of Tdap during adulthood.   [facility] Pediatricians/Family Doctors [facility]): [address] Colette Ribas, [phone]           [facility]: [address], [phone]                [facility]): [address], [phone] (call to ask if accepting patients) [facility] Department: [address], [place], [phone]    [facility] Pediatricians/Family Doctors [facility]): [phone], [phone] [facility]: [address] [place], [phone] [facility]: [address], [phone]  [facility] Doctors  [facility]): [phone] [facility]: [address] [phone]   [facility] Doctors [facility]: [address] [phone]  [facility] Doctors  [facility]: [phone], [phone]  Home Blood Pressure Monitoring for Patients  Your provider has recommended that you check your blood pressure (BP) at least once a week at home. If you do not have a blood pressure cuff at home, one will be provided for you. Contact your provider if you have not received your monitor within 1 week.   Helpful Tips for Accurate Home Blood Pressure Checks  Don't smoke, exercise, or drink caffeine 30 minutes before checking your BP Use the restroom  before checking your BP (a full bladder can raise your pressure) Relax in a comfortable upright chair Feet on the ground Left arm resting comfortably on a flat surface at the level of your heart Legs uncrossed Back supported Sit quietly and don't talk Place the cuff on your bare arm Adjust snuggly, so that only two fingertips can fit between your skin and the top of the cuff Check 2 readings separated by at least one minute Keep a log of your BP readings For a visual, please reference this diagram: http://ccnc.care/bpdiagram  Provider Name: [facility]     Phone: [phone]  Zone 1: ALL CLEAR  Continue to monitor your symptoms:  BP reading is less than 140 (top number) or less than 90 (bottom number)  No right upper stomach pain No headaches or seeing spots No feeling nauseated or throwing up No swelling in face and hands  Zone 2: CAUTION Call your doctor's office for any of the following:  BP reading is greater than 140 (top number) or greater than 90 (bottom number)  Stomach pain under your ribs in the middle or right side Headaches or seeing spots Feeling nauseated or throwing up Swelling in face and hands  Zone 3: EMERGENCY  Seek immediate medical care if you have any of the following:  BP reading is greater than160 (top number) or greater than 110 (bottom number) Severe headaches not improving with Tylenol Serious difficulty catching your breath Any worsening symptoms from Zone 2  Preterm Labor and Birth Information  The normal length of a pregnancy is 39-41 weeks. Preterm labor is when labor starts before 37 completed weeks of pregnancy. What are the risk factors for preterm labor? Preterm labor is more likely to occur in women who: Have certain infections during pregnancy such as a bladder infection, sexually transmitted infection, or infection inside the uterus (chorioamnionitis). Have a shorter-than-normal cervix. Have gone into preterm labor  before. Have had surgery on their cervix. Are younger than age 46 or older than age 50. Are African American. Are pregnant with twins or multiple babies (multiple gestation). Take street drugs or smoke while pregnant. Do not gain enough weight while pregnant. Became pregnant shortly after having been pregnant. What are the symptoms of preterm labor? Symptoms of preterm labor include: Cramps similar to those that can happen during a menstrual period. The cramps may happen with diarrhea. Pain in the abdomen or lower back. Regular uterine contractions that may feel like tightening of the abdomen. A feeling of increased pressure in the pelvis. Increased watery or bloody mucus discharge from the vagina. Water breaking (ruptured amniotic sac). Why is it important to recognize signs of preterm labor? It is important to recognize signs of preterm labor because babies who are born prematurely may not be fully developed. This can put them at an increased risk for: Long-term (chronic) heart and lung problems. Difficulty immediately after birth with regulating body systems, including blood sugar, body temperature, heart rate, and breathing rate. Bleeding in the brain. Cerebral palsy. Learning difficulties. Death. These risks are highest  for babies who are born before 45 weeks of pregnancy. How is preterm labor treated? Treatment depends on the length of your pregnancy, your condition, and the health of your baby. It may involve: Having a stitch (suture) placed in your cervix to prevent your cervix from opening too early (cerclage). Taking or being given medicines, such as: Hormone medicines. These may be given early in pregnancy to help support the pregnancy. Medicine to stop contractions. Medicines to help mature the baby's lungs. These may be prescribed if the risk of delivery is high. Medicines to prevent your baby from developing cerebral palsy. If the labor happens before 34 weeks of  pregnancy, you may need to stay in the hospital. What should I do if I think I am in preterm labor? If you think that you are going into preterm labor, call your health care provider right away. How can I prevent preterm labor in future pregnancies? To increase your chance of having a full-term pregnancy: Do not use any tobacco products, such as cigarettes, chewing tobacco, and e-cigarettes. If you need help quitting, ask your health care provider. Do not use street drugs or medicines that have not been prescribed to you during your pregnancy. Talk with your health care provider before taking any herbal supplements, even if you have been taking them regularly. Make sure you gain a healthy amount of weight during your pregnancy. Watch for infection. If you think that you might have an infection, get it checked right away. Make sure to tell your health care provider if you have gone into preterm labor before. This information is not intended to replace advice given to you by your health care provider. Make sure you discuss any questions you have with your health care provider. Document Revised: 05/03/2018 Document Reviewed: 06/02/2015 Elsevier Patient Education  [place].

## 2021-09-02 ENCOUNTER — Inpatient Hospital Stay (HOSPITAL_COMMUNITY)
Admission: AD | Admit: 2021-09-02 | Discharge: 2021-09-09 | DRG: 788 | Disposition: A | Discharge status: Home / Self Care | Payer: Medicaid Other | Attending: Obstetrics and Gynecology | Admitting: Obstetrics and Gynecology

## 2021-09-02 ENCOUNTER — Encounter (HOSPITAL_COMMUNITY): Payer: Self-pay | Admitting: Obstetrics & Gynecology

## 2021-09-02 ENCOUNTER — Telehealth: Payer: Self-pay | Admitting: *Deleted

## 2021-09-02 ENCOUNTER — Inpatient Hospital Stay (HOSPITAL_COMMUNITY): Payer: Medicaid Other

## 2021-09-02 ENCOUNTER — Other Ambulatory Visit: Payer: Self-pay

## 2021-09-02 DIAGNOSIS — O99284 Endocrine, nutritional and metabolic diseases complicating childbirth: Secondary | ICD-10-CM | POA: Diagnosis present

## 2021-09-02 DIAGNOSIS — O42913 Preterm premature rupture of membranes, unspecified as to length of time between rupture and onset of labor, third trimester: Principal | ICD-10-CM | POA: Diagnosis present

## 2021-09-02 DIAGNOSIS — Z87891 Personal history of nicotine dependence: Secondary | ICD-10-CM

## 2021-09-02 DIAGNOSIS — Z880 Allergy status to penicillin: Secondary | ICD-10-CM | POA: Diagnosis not present

## 2021-09-02 DIAGNOSIS — Z3A33 33 weeks gestation of pregnancy: Secondary | ICD-10-CM | POA: Diagnosis not present

## 2021-09-02 DIAGNOSIS — O42111 Preterm premature rupture of membranes, onset of labor more than 24 hours following rupture, first trimester: Secondary | ICD-10-CM | POA: Diagnosis not present

## 2021-09-02 DIAGNOSIS — Z3A34 34 weeks gestation of pregnancy: Secondary | ICD-10-CM | POA: Diagnosis not present

## 2021-09-02 DIAGNOSIS — Z7982 Long term (current) use of aspirin: Secondary | ICD-10-CM

## 2021-09-02 DIAGNOSIS — E039 Hypothyroidism, unspecified: Secondary | ICD-10-CM | POA: Diagnosis present

## 2021-09-02 DIAGNOSIS — O42119 Preterm premature rupture of membranes, onset of labor more than 24 hours following rupture, unspecified trimester: Secondary | ICD-10-CM | POA: Diagnosis not present

## 2021-09-02 DIAGNOSIS — O321XX0 Maternal care for breech presentation, not applicable or unspecified: Secondary | ICD-10-CM | POA: Diagnosis present

## 2021-09-02 DIAGNOSIS — O42113 Preterm premature rupture of membranes, onset of labor more than 24 hours following rupture, third trimester: Secondary | ICD-10-CM | POA: Diagnosis not present

## 2021-09-02 DIAGNOSIS — O4290 Premature rupture of membranes, unspecified as to length of time between rupture and onset of labor, unspecified weeks of gestation: Secondary | ICD-10-CM | POA: Diagnosis present

## 2021-09-02 DIAGNOSIS — O42013 Preterm premature rupture of membranes, onset of labor within 24 hours of rupture, third trimester: Secondary | ICD-10-CM | POA: Diagnosis not present

## 2021-09-02 DIAGNOSIS — O42919 Preterm premature rupture of membranes, unspecified as to length of time between rupture and onset of labor, unspecified trimester: Principal | ICD-10-CM

## 2021-09-02 LAB — TYPE AND SCREEN
ABO/RH(D): O POS
Antibody Screen: NEGATIVE

## 2021-09-02 LAB — CBC
HCT: 35.1 % — ABNORMAL LOW (ref 36.0–46.0)
Hemoglobin: 11.6 g/dL — ABNORMAL LOW (ref 12.0–15.0)
MCH: 29.9 pg (ref 26.0–34.0)
MCHC: 33 g/dL (ref 30.0–36.0)
MCV: 90.5 fL (ref 80.0–100.0)
Platelets: 220 10*3/uL (ref 150–400)
RBC: 3.88 MIL/uL (ref 3.87–5.11)
RDW: 13.1 % (ref 11.5–15.5)
WBC: 10.6 10*3/uL — ABNORMAL HIGH (ref 4.0–10.5)
nRBC: 0 % (ref 0.0–0.2)

## 2021-09-02 LAB — POCT FERN TEST: POCT Fern Test: POSITIVE

## 2021-09-02 LAB — AMNISURE RUPTURE OF MEMBRANE (ROM) NOT AT ARMC: Amnisure ROM: POSITIVE

## 2021-09-02 MED ORDER — CEPHALEXIN 500 MG PO CAPS
500.0000 mg | ORAL_CAPSULE | Freq: Four times a day (QID) | ORAL | Status: DC
  Administered 2021-09-04 – 2021-09-09 (×17): 500 mg via ORAL
  Filled 2021-09-02 (×19): qty 1

## 2021-09-02 MED ORDER — ENOXAPARIN SODIUM 40 MG/0.4ML IJ SOSY
40.0000 mg | PREFILLED_SYRINGE | INTRAMUSCULAR | Status: DC
  Administered 2021-09-02 – 2021-09-06 (×5): 40 mg via SUBCUTANEOUS
  Filled 2021-09-02 (×5): qty 0.4

## 2021-09-02 MED ORDER — TERBUTALINE SULFATE 1 MG/ML IJ SOLN
INTRAMUSCULAR | Status: AC
  Filled 2021-09-02: qty 1

## 2021-09-02 MED ORDER — CEFAZOLIN SODIUM-DEXTROSE 1-4 GM/50ML-% IV SOLN
1.0000 g | Freq: Three times a day (TID) | INTRAVENOUS | Status: AC
  Administered 2021-09-02 – 2021-09-04 (×6): 1 g via INTRAVENOUS
  Filled 2021-09-02 (×6): qty 50

## 2021-09-02 MED ORDER — DOCUSATE SODIUM 100 MG PO CAPS
100.0000 mg | ORAL_CAPSULE | Freq: Every day | ORAL | Status: DC
  Administered 2021-09-03 – 2021-09-08 (×6): 100 mg via ORAL
  Filled 2021-09-02 (×6): qty 1

## 2021-09-02 MED ORDER — CALCIUM CARBONATE ANTACID 500 MG PO CHEW
2.0000 | CHEWABLE_TABLET | ORAL | Status: DC | PRN
Start: 2021-09-02 — End: 2021-09-09

## 2021-09-02 MED ORDER — TERBUTALINE SULFATE 1 MG/ML IJ SOLN
0.2500 mg | Freq: Once | INTRAMUSCULAR | Status: AC
  Administered 2021-09-02: 0.25 mg via SUBCUTANEOUS

## 2021-09-02 MED ORDER — AZITHROMYCIN 250 MG PO TABS
1000.0000 mg | ORAL_TABLET | Freq: Once | ORAL | Status: AC
  Administered 2021-09-02: 1000 mg via ORAL
  Filled 2021-09-02: qty 4

## 2021-09-02 MED ORDER — PRENATAL MULTIVITAMIN CH
1.0000 | ORAL_TABLET | Freq: Every day | ORAL | Status: DC
  Administered 2021-09-02 – 2021-09-06 (×5): 1 via ORAL
  Filled 2021-09-02 (×5): qty 1

## 2021-09-02 MED ORDER — ACETAMINOPHEN 325 MG PO TABS
650.0000 mg | ORAL_TABLET | ORAL | Status: DC | PRN
  Administered 2021-09-03: 650 mg via ORAL
  Filled 2021-09-02: qty 2

## 2021-09-02 MED ORDER — ZOLPIDEM TARTRATE 5 MG PO TABS: 5.0000 mg | ORAL_TABLET | Freq: Every evening | ORAL | Status: DC | PRN

## 2021-09-02 MED ORDER — LEVOTHYROXINE SODIUM 25 MCG PO TABS
75.0000 ug | ORAL_TABLET | Freq: Every day | ORAL | Status: DC
  Administered 2021-09-03 – 2021-09-09 (×7): 75 ug via ORAL
  Filled 2021-09-02 (×7): qty 3

## 2021-09-02 MED ORDER — VALACYCLOVIR HCL 500 MG PO TABS
500.0000 mg | ORAL_TABLET | Freq: Two times a day (BID) | ORAL | Status: DC
  Administered 2021-09-02 – 2021-09-09 (×14): 500 mg via ORAL
  Filled 2021-09-02 (×14): qty 1

## 2021-09-02 MED ORDER — BETAMETHASONE SOD PHOS & ACET 6 (3-3) MG/ML IJ SUSP: 12.0000 mg | INTRAMUSCULAR | Status: DC

## 2021-09-02 MED ORDER — BETAMETHASONE SOD PHOS & ACET 6 (3-3) MG/ML IJ SUSP
12.0000 mg | INTRAMUSCULAR | Status: AC
  Administered 2021-09-02 – 2021-09-03 (×2): 12 mg via INTRAMUSCULAR
  Filled 2021-09-02: qty 5

## 2021-09-02 NOTE — MAU Provider Note (Signed)
History     CSN: [id number]  Arrival date and time: 09/02/21 1022   None     No chief complaint on file.  HPI  Melissa Ayers is a 21 y.o. female G2P0010 @ [redacted]w[redacted]d here in MAU with complaints of leaking of fluid. The fluid began leaking this morning around 0900. She noticed a large gush of clear fluid. She does report having sex last night around 9 PM, no issues. She has had an overall uncomplicated pregnancy. She has no pain 0/10. + fetal movement.   OB History     Gravida  2   Para      Term      Preterm      AB  1   Living         SAB  1   IAB      Ectopic      Multiple      Live Births              Past Medical History:  Diagnosis Date   Depression    GERD (gastroesophageal reflux disease)    HSV-2 infection    Hypothyroidism     Past Surgical History:  Procedure Laterality Date   TONSILLECTOMY Bilateral 11/24/2019   Procedure: TONSILLECTOMY;  Surgeon: Newman Pies, MD;  Location: [facility];  Service: ENT;  Laterality: Bilateral;   WISDOM TOOTH EXTRACTION      Family History  Problem Relation Age of Onset   Lung cancer Maternal Grandmother     Social History   Tobacco Use   Smoking status: Former    Packs/day: 0.25    Types: Cigarettes   Smokeless tobacco: Never  Vaping Use   Vaping Use: Former  Substance Use Topics   Alcohol use: Never   Drug use: Not Currently    Types: Marijuana    Allergies:  Allergies  Allergen Reactions   Penicillins Rash    Medications Prior to Admission  Medication Sig Dispense Refill Last Dose   aspirin 81 MG chewable tablet Chew by mouth daily.   09/02/2021   levothyroxine (SYNTHROID) 75 MCG tablet Take 75 mcg by mouth daily.   09/02/2021   Prenatal Vit-Fe Fumarate-FA (PRENATAL VITAMIN PO) Take by mouth.   09/01/2021   valACYclovir (VALTREX) 500 MG tablet Take 500 mg by mouth daily.   Past Week   Blood Pressure Monitor MISC For regular home bp monitoring during pregnancy 1 each 0     Review of Systems  Constitutional:  Negative for fever.  Gastrointestinal:  Negative for abdominal pain.  Genitourinary:  Positive for vaginal discharge.   Physical Exam   Blood pressure 126/78, pulse (!) 105, temperature 98.2 F (36.8 C), temperature source Oral, resp. rate 18, height 5\' 6"  (1.676 m), weight 91.8 kg, last menstrual period 01/12/2021, SpO2 99 %.  Physical Exam Vitals and nursing note reviewed.  Constitutional:      General: She is not in acute distress.    Appearance: Normal appearance. She is not ill-appearing, toxic-appearing or diaphoretic.  Abdominal:     Palpations: Abdomen is soft.  Genitourinary:    Comments: Vagina - Large amount of clear fluid pooling in the vagina.  Cervix - Not visualized.  Bimanual exam: deferred  Chaperone present for exam.   Musculoskeletal:        General: Normal range of motion.  Skin:    General: Skin is warm.  Neurological:     Mental Status: She is alert  and oriented to person, place, and time.  Psychiatric:        Mood and Affect: Mood normal.    MAU Course  Procedures  MDM  Crist Fat + Amnisure + Discussed patient with Dr. Despina Hidden.   Assessment and Plan   A:  1. Preterm premature rupture of membranes (PPROM) with unknown onset of labor   2. [redacted] weeks gestation of pregnancy     P:  Category 1 fetal tracing.  Admit to OBS GBS BMZ  Anticipate NICU consult.   Venia Carbon I, NP. 09/02/2021 7:30 PM

## 2021-09-02 NOTE — MAU Note (Signed)
Melissa Ayers is a 21 y.o. at [redacted]w[redacted]d here in MAU reporting: woke up and the bed was wet.  Stood up and a pile of wet stuff came out.  No bleeding or pain. Put a pad on, was leaking out so much, that she took it off. Had some mucous when she used the toilet. Intercourse yesterday, afternoon  Onset of complaint: 0900 Pain score: none Vitals:   09/02/21 1034  BP: 125/80  Pulse: (!) 107  Resp: 18  Temp: 98.2 F (36.8 C)  SpO2: 99%     FHT:140 Lab orders placed from triage:  fern

## 2021-09-02 NOTE — Consult Note (Signed)
[facility]  Prenatal Consult       09/02/2021  3:03 PM   I was asked by Dr. Despina Hidden to consult on this patient for anticipated preterm delivery. I had the pleasure of meeting with Melissa Ayers and her partner today. She is a 21yo G2P0 woman currently at [redacted]w[redacted]d. Pregnancy largely uncomplicated until she ruptured today, confirmed by [facility]. She does have a history of hypothyrodism which is stable on levothyroxine and HSV2, on Valtrex. She is now admitted for monitoring, latency antibiotics, and betamethasone (received first dose today). She and her partner are expecting a baby boy to be name Melissa Ayers (I did not confirm the spelling).  I explained that the neonatal intensive care team would be present for the delivery and outlined the likely delivery room course for this baby including routine resuscitation and NRP-guided approaches to the treatment of respiratory distress. We discussed other common problems associated with prematurity including respiratory distress syndrome/CLD, apnea, feeding issues, temperature regulation, and infection risk.   We discussed the average length of stay but I noted that the actual LOS would depend on the severity of problems encountered and response to treatments. We discussed visitation policies and the resources available while her child is in the hospital.  We discussed the importance of good nutrition and various methods of providing nutrition (parenteral hyperalimentation, gavage feedings and/or oral feeding). Mother plans to breastfeed. We discussed the benefits of human milk. I encouraged breast feeding and pumping soon after birth and outlined resources that are available to support breast feeding. We discussed the possibility of using donor breast milk as a bridge, to which she is amenable.  Thank you for involving [place] in the care of this patient. A member of our team will be available should the family have additional questions.  Time for  consultation: approximately 20 minutes of face-to-face time in discussion of the risks and medical care associated with preterm delivery.  Jacob Moores, MD Neonatal Medicine

## 2021-09-02 NOTE — Telephone Encounter (Signed)
Patient states she woke up this morning wet and when she stood up a huge gush of fluid came out.  She went to the bathroom and thinks she lost her mucous plug.  Denies cramping or bleeding and baby is active.  Advised patient to go to [facility] for evaluation for rupture as we only have one provider in our office today and do not have any workin appointments.  Pt verbalized understanding and agreeable to go.

## 2021-09-02 NOTE — H&P (Signed)
  HPI   Melissa Ayers is a 21 y.o. female G2P0010 @ [redacted]w[redacted]d here in MAU with complaints of leaking of fluid. The fluid began leaking this morning around 0900. She noticed a large gush of clear fluid. She does report having sex last night around 9 PM, no issues. She has had an overall uncomplicated pregnancy. She has no pain 0/10. + fetal movement.    OB History       Gravida  2   Para      Term      Preterm      AB  1   Living           SAB  1   IAB      Ectopic      Multiple      Live Births                      Past Medical History:  Diagnosis Date   Depression     GERD (gastroesophageal reflux disease)     HSV-2 infection     Hypothyroidism             Past Surgical History:  Procedure Laterality Date   TONSILLECTOMY Bilateral 11/24/2019    Procedure: TONSILLECTOMY;  Surgeon: Newman Pies, MD;  Location: [facility];  Service: ENT;  Laterality: Bilateral;   WISDOM TOOTH EXTRACTION               Family History  Problem Relation Age of Onset   Lung cancer Maternal Grandmother        Social History         Tobacco Use   Smoking status: Former      Packs/day: 0.25      Types: Cigarettes   Smokeless tobacco: Never  Vaping Use   Vaping Use: Former  Substance Use Topics   Alcohol use: Never   Drug use: Not Currently      Types: Marijuana      Allergies:      Allergies  Allergen Reactions   Penicillins Rash             Medications Prior to Admission  Medication Sig Dispense Refill Last Dose   aspirin 81 MG chewable tablet Chew by mouth daily.     09/02/2021   levothyroxine (SYNTHROID) 75 MCG tablet Take 75 mcg by mouth daily.     09/02/2021   Prenatal Vit-Fe Fumarate-FA (PRENATAL VITAMIN PO) Take by mouth.     09/01/2021   valACYclovir (VALTREX) 500 MG tablet Take 500 mg by mouth daily.     Past Week   Blood Pressure Monitor MISC For regular home bp monitoring during pregnancy 1 each 0        Review of Systems Physical  Exam    Blood pressure 126/78, pulse (!) 105, temperature 98.2 F (36.8 C), temperature source Oral, resp. rate 18, height 5\' 6"  (1.676 m), weight 91.8 kg, last menstrual period 01/12/2021, SpO2 99 %.   Physical Exam      Assessment and Plan  [redacted]w[redacted]d G2P0010  P/PROM  Latency antibioitcs Sonogram Betamethasone x 2 \\conservative  management  Recommend delivery if labors or at 34 weeks if does not    [redacted]w[redacted]d 09/02/2021, 11:05 AM   11/02/2021, MD 09/02/2021 3:50 PM

## 2021-09-03 DIAGNOSIS — Z3A33 33 weeks gestation of pregnancy: Secondary | ICD-10-CM | POA: Diagnosis not present

## 2021-09-03 DIAGNOSIS — O42113 Preterm premature rupture of membranes, onset of labor more than 24 hours following rupture, third trimester: Secondary | ICD-10-CM | POA: Diagnosis not present

## 2021-09-03 LAB — CULTURE, BETA STREP (GROUP B ONLY)

## 2021-09-03 LAB — OB RESULTS CONSOLE GBS: GBS: NEGATIVE

## 2021-09-03 MED ORDER — SODIUM CHLORIDE 0.9% FLUSH
3.0000 mL | Freq: Two times a day (BID) | INTRAVENOUS | Status: DC
  Administered 2021-09-03 – 2021-09-08 (×10): 3 mL via INTRAVENOUS

## 2021-09-03 NOTE — Progress Notes (Signed)
Patient ID: Melissa Ayers, female   DOB: [birth year], 21 y.o.   MRN: [id number] FACULTY PRACTICE ANTEPARTUM(COMPREHENSIVE) NOTE  Melissa Ayers is a 21 y.o. G2P0010 with Estimated Date of Delivery: 10/19/21   By  early ultrasound [redacted]w[redacted]d  who is admitted for PROM.    Fetal presentation is Breech. Length of Stay:  1  Days  Date of admission:09/02/2021  Subjective: Some intermittent uterine activity mild Patient reports the fetal movement as active. Patient reports uterine contraction  activity as epriosdic irregular. Patient reports  vaginal bleeding as none. Patient describes fluid per vagina as Clear.  Vitals:  Blood pressure 129/66, pulse 91, temperature 98.6 F (37 C), temperature source Oral, resp. rate 16, height 5\' 6"  (1.676 m), weight 91.8 kg, last menstrual period 01/12/2021, SpO2 99 %. Vitals:   09/02/21 1433 09/02/21 1737 09/02/21 2010 09/03/21 0352  BP:  117/72 125/63 129/66  Pulse:  (!) 117 99 91  Resp:  16 16 16   Temp:  98.1 F (36.7 C) 98 F (36.7 C) 98.6 F (37 C)  TempSrc:  Oral Oral Oral  SpO2: 97% 99% 98% 99%  Weight:      Height:       Physical Examination:  General appearance - alert, well appearing, and in no distress Abdomen - soft, nontender, nondistended, no masses or organomegaly Fundal Height:  size equals dates Pelvic Exam:  examination not indicated Cervical Exam: Not evaluated. . Extremities: extremities normal, atraumatic, no cyanosis or edema with DTRs 2+ bilaterally Membranes:ruptured, clear fluid  Fetal Monitoring:  Baseline: 130s bpm, Variability: Good {> 6 bpm), Accelerations: Reactive, and Decelerations: Variable: mild   reactive  Labs:  Results for orders placed or performed during the hospital encounter of 09/02/21 (from the past 24 hour(s))  Amnisure rupture of membrane (rom)not at [facility]   Collection Time: 09/02/21 11:05 AM  Result Value Ref Range   Amnisure ROM POSITIVE   Fern Test   Collection Time: 09/02/21 11:08 AM  Result Value Ref  Range   POCT Fern Test Positive = ruptured amniotic membanes   Culture, beta strep (group b only)   Collection Time: 09/02/21 11:24 AM   Specimen: Vaginal/Rectal; Genital  Result Value Ref Range   Specimen Description VAGINAL/RECTAL    Special Requests NONE    Culture (A)     GROUP B STREP(S.AGALACTIAE)ISOLATED TESTING AGAINST S. AGALACTIAE NOT ROUTINELY PERFORMED DUE TO PREDICTABILITY OF AMP/PEN/VAN SUSCEPTIBILITY. Performed at [facility], [address] [place], [address] [place]    Report Status 09/03/2021 FINAL   CBC   Collection Time: 09/02/21 11:24 AM  Result Value Ref Range   WBC 10.6 (H) 4.0 - 10.5 K/uL   RBC 3.88 3.87 - 5.11 MIL/uL   Hemoglobin 11.6 (L) 12.0 - 15.0 g/dL   HCT 11/03/2021 (L) 11/02/21 - 76.1 %   MCV 90.5 80.0 - 100.0 fL   MCH 29.9 26.0 - 34.0 pg   MCHC 33.0 30.0 - 36.0 g/dL   RDW 60.7 37.1 - 06.2 %   Platelets 220 150 - 400 K/uL   nRBC 0.0 0.0 - 0.2 %  Type and screen [facility]   Collection Time: 09/02/21 11:24 AM  Result Value Ref Range   ABO/RH(D) O POS    Antibody Screen NEG    Sample Expiration      09/05/2021,2359 Performed at [facility], [address] [place], [address] [place]     Imaging Studies:    [place] MFM  FETAL BPP WO NON STRESS  Result Date: 09/02/2021 ----------------------------------------------------------------------  OBSTETRICS REPORT                       (Signed Final 09/02/2021 04:32 pm) ---------------------------------------------------------------------- Patient Info  ID #:       [id number]                          D.O.B.:  2000-10-18 (21 yrs)  Name:       Melissa Ayers                   Visit Date: 09/02/2021 12:02 pm ---------------------------------------------------------------------- Performed By  Attending:        Lin Landsman      Ref. Address:     [address]                    [place]                                                             [place], [place]                                                              [address]  Performed By:     Reinaldo Raddle            [facility]  Referred By:      Lazaro Arms                    MD ---------------------------------------------------------------------- Orders  #  Description                           Code        Ordered By  1  [place] MFM OB COMP + 14 WK                76805.01    Alexzandra Bilton  2  [place] MFM FETAL BPP WO NON               19147.82    Soul Hackman     STRESS ----------------------------------------------------------------------  #  Order #                     Accession #                Episode #  1  [id number]                   [id number]  [id number]  2  [id number]                   [id number]                 [id number] ---------------------------------------------------------------------- Indications  Premature rupture of membranes - leaking       O42.90  fluid  Decreased fetal movements, third trimester,    O36.8130  unspecified  [redacted] weeks gestation of pregnancy                Z3A.33  Hypothyroid                                    O99.280 E03.9 ---------------------------------------------------------------------- Fetal Evaluation  Num Of Fetuses:         1  Fetal Heart Rate(bpm):  145  Cardiac Activity:       Observed  Presentation:           Breech  Placenta:               Anterior  P. Cord Insertion:      Not well visualized  Amniotic Fluid  AFI FV:      Within normal limits  AFI Sum(cm)     %Tile       Largest Pocket(cm)  8.86            10          3.69  RUQ(cm)       RLQ(cm)       LUQ(cm)        LLQ(cm)  3.69          0.61          2.03           2.53 ---------------------------------------------------------------------- Biophysical Evaluation  Amniotic F.V:   Within normal limits       F. Tone:        Observed  F. Movement:    Observed                   Score:          6/8  F. Breathing:   Not Observed  ---------------------------------------------------------------------- Biometry  BPD:     89.45  mm     G. Age:  36w 1d         98  %    CI:        84.26   %    70 - 86                                                          FL/HC:      20.4   %    19.9 - 21.5  HC:    307.11   mm     G. Age:  34w 2d         37  %    HC/AC:      1.09        0.96 - 1.11  AC:    281.78   mm     G. Age:  32w 2d         22  %    FL/BPD:     70.0   %  71 - 87  FL:       62.6  mm     G. Age:  32w 3d         18  %    FL/AC:      22.2   %    20 - 24  HUM:      55.8  mm     G. Age:  32w 3d         41  %  CER:      42.4  mm     G. Age:  33w 4d         34  %  LV:        3.5  mm  CM:        7.9  mm  Est. FW:    2056  gm      4 lb 9 oz     28  % ---------------------------------------------------------------------- OB History  Gravidity:    2          SAB:   1  Living:       0 ---------------------------------------------------------------------- Gestational Age  LMP:           33w 2d        Date:  01/12/21                 EDD:   10/19/21  U/S Today:     33w 6d                                        EDD:   10/15/21  Best:          33w 2d     Det. By:  LMP  (01/12/21)          EDD:   10/19/21 ---------------------------------------------------------------------- Anatomy  Cranium:               Appears normal         Aortic Arch:            Not well visualized  Cavum:                 Appears normal         Ductal Arch:            Not well visualized  Ventricles:            Appears normal         Diaphragm:              Appears normal  Choroid Plexus:        Not well visualized    Stomach:                Appears normal, left                                                                        sided  Cerebellum:            Appears normal         Abdomen:  Appears normal  Posterior Fossa:       Appears normal         Abdominal Wall:         Not well visualized  Nuchal Fold:           Not applicable (>20    Cord Vessels:           Not  well visualized                         wks GA)  Face:                  Appears normal         Kidneys:                Appear normal                         (orbits and profile)  Lips:                  Not well visualized    Bladder:                Appears normal  Thoracic:              Appears normal         Spine:                  Not well visualized  Heart:                 Appears normal         Upper Extremities:      Not well visualized                         (4CH, axis, and                         situs)  RVOT:                  Not well visualized    Lower Extremities:      Not well visualized  LVOT:                  Not well visualized  Other:  Technicallly difficult due to advanced GA and maternal habitus. ---------------------------------------------------------------------- Cervix Uterus Adnexa  Cervix  Not visualized (advanced GA >24wks)  Uterus  No abnormality visualized.  Right Ovary  Not visualized.  Left Ovary  Not visualized.  Cul De Sac  No free fluid seen.  Adnexa  No adnexal mass visualized. ---------------------------------------------------------------------- Impression  Single intrauterine pregnancy here for a detailed anatomy  due to  suspected premature rupture of membranes (  Amniosure +)  Normal anatomy with measurements consistent with dates  There is good fetal movement and amniotic fluid volume  Suboptimal views of the fetal anatomy were obtained  secondary to fetal position.  Biophysical profile 6/8  Consider NST given 6/8 BPP ---------------------------------------------------------------------- Recommendations  Management per inpatient providers. ----------------------------------------------------------------------              Lin Landsman, MD Electronically Signed Final Report   09/02/2021 04:32 pm ----------------------------------------------------------------------  [place] MFM OB COMP + 14 WK  Result Date:  09/02/2021 ----------------------------------------------------------------------  OBSTETRICS REPORT                       (  Signed Final 09/02/2021 04:32 pm) ---------------------------------------------------------------------- Patient Info  ID #:       [id number]                          D.O.B.:  2000-02-13 (21 yrs)  Name:       Melissa Ayers                   Visit Date: 09/02/2021 12:02 pm ---------------------------------------------------------------------- Performed By  Attending:        Lin Landsman      Ref. Address:     [address]                    [place]                                                             [place], [place]                                                             [address]  Performed By:     Reinaldo Raddle            [facility]  Referred By:      Lazaro Arms                    MD ---------------------------------------------------------------------- Orders  #  Description                           Code        Ordered By  1  [place] MFM OB COMP + 14 WK                76805.01    Jt Brabec  2  [place] MFM FETAL BPP WO NON               76819.01    Dasja Brase     STRESS ----------------------------------------------------------------------  #  Order #                     Accession #                Episode #  1  [id number]                   [id number]                 [id number]  2  [id number]                   [id number]  [id number] ---------------------------------------------------------------------- Indications  Premature rupture of membranes - leaking       O42.90  fluid  Decreased fetal movements, third trimester,    O36.8130  unspecified  [redacted] weeks gestation of pregnancy                Z3A.33  Hypothyroid                                    O99.280 E03.9 ---------------------------------------------------------------------- Fetal Evaluation  Num Of Fetuses:         1  Fetal Heart  Rate(bpm):  145  Cardiac Activity:       Observed  Presentation:           Breech  Placenta:               Anterior  P. Cord Insertion:      Not well visualized  Amniotic Fluid  AFI FV:      Within normal limits  AFI Sum(cm)     %Tile       Largest Pocket(cm)  8.86            10          3.69  RUQ(cm)       RLQ(cm)       LUQ(cm)        LLQ(cm)  3.69          0.61          2.03           2.53 ---------------------------------------------------------------------- Biophysical Evaluation  Amniotic F.V:   Within normal limits       F. Tone:        Observed  F. Movement:    Observed                   Score:          6/8  F. Breathing:   Not Observed ---------------------------------------------------------------------- Biometry  BPD:     89.45  mm     G. Age:  36w 1d         98  %    CI:        84.26   %    70 - 86                                                          FL/HC:      20.4   %    19.9 - 21.5  HC:    307.11   mm     G. Age:  34w 2d         37  %    HC/AC:      1.09        0.96 - 1.11  AC:    281.78   mm     G. Age:  32w 2d         22  %    FL/BPD:     70.0   %    71 - 87  FL:       62.6  mm     G. Age:  32w 3d         18  %  FL/AC:      22.2   %    20 - 24  HUM:      55.8  mm     G. Age:  32w 3d         41  %  CER:      42.4  mm     G. Age:  33w 4d         34  %  LV:        3.5  mm  CM:        7.9  mm  Est. FW:    2056  gm      4 lb 9 oz     28  % ---------------------------------------------------------------------- OB History  Gravidity:    2          SAB:   1  Living:       0 ---------------------------------------------------------------------- Gestational Age  LMP:           33w 2d        Date:  01/12/21                 EDD:   10/19/21  U/S Today:     33w 6d                                        EDD:   10/15/21  Best:          33w 2d     Det. By:  LMP  (01/12/21)          EDD:   10/19/21 ---------------------------------------------------------------------- Anatomy  Cranium:               Appears normal          Aortic Arch:            Not well visualized  Cavum:                 Appears normal         Ductal Arch:            Not well visualized  Ventricles:            Appears normal         Diaphragm:              Appears normal  Choroid Plexus:        Not well visualized    Stomach:                Appears normal, left                                                                        sided  Cerebellum:            Appears normal         Abdomen:                Appears normal  Posterior Fossa:       Appears normal         Abdominal Wall:         Not well visualized  Nuchal Fold:           Not applicable (>20    Cord Vessels:           Not well visualized                         wks GA)  Face:                  Appears normal         Kidneys:                Appear normal                         (orbits and profile)  Lips:                  Not well visualized    Bladder:                Appears normal  Thoracic:              Appears normal         Spine:                  Not well visualized  Heart:                 Appears normal         Upper Extremities:      Not well visualized                         (4CH, axis, and                         situs)  RVOT:                  Not well visualized    Lower Extremities:      Not well visualized  LVOT:                  Not well visualized  Other:  Technicallly difficult due to advanced GA and maternal habitus. ---------------------------------------------------------------------- Cervix Uterus Adnexa  Cervix  Not visualized (advanced GA >24wks)  Uterus  No abnormality visualized.  Right Ovary  Not visualized.  Left Ovary  Not visualized.  Cul De Sac  No free fluid seen.  Adnexa  No adnexal mass visualized. ---------------------------------------------------------------------- Impression  Single intrauterine pregnancy here for a detailed anatomy  due to  suspected premature rupture of membranes (  Amniosure +)  Normal anatomy with measurements consistent with dates  There is  good fetal movement and amniotic fluid volume  Suboptimal views of the fetal anatomy were obtained  secondary to fetal position.  Biophysical profile 6/8  Consider NST given 6/8 BPP ---------------------------------------------------------------------- Recommendations  Management per inpatient providers. ----------------------------------------------------------------------              Lin Landsman, MD Electronically Signed Final Report   09/02/2021 04:32 pm ----------------------------------------------------------------------    Medications:  Scheduled  betamethasone acetate-betamethasone sodium phosphate  12 mg Intramuscular Q24 Hr x 2   [START ON 09/04/2021] cephALEXin  500 mg Oral QID   docusate sodium  100 mg Oral Daily   enoxaparin (LOVENOX) injection  40 mg Subcutaneous Q24H   levothyroxine  75 mcg Oral QAC breakfast   prenatal multivitamin  1 tablet Oral Q1200   valACYclovir  500  mg Oral BID   I have reviewed the patient's current medications.  ASSESSMENT: G2P0010 [redacted]w[redacted]d Estimated Date of Delivery: 10/19/21  Patient Active Problem List   Diagnosis Date Noted   PROM (premature rupture of membranes) 09/02/2021   Hypothyroidism 05/31/2021   Encounter for supervision of normal pregnancy, antepartum 04/11/2021   Abnormal Pap smear of cervix 12/06/2020   Genital herpes 04/06/2017  BREECH, frank  PLAN: >BMZ 2nd dose today >Latency antibiotics >C section/IOL scheduled 09/07/21 >twice daily monitoring >Valtrex prophylaxis  Delivery mode options to depend on team present on Wednesday and consenting the patient, should be a game time decision with sonogram Wednesday am to evaluate specifics of the presentation and then counsel patient   If labors ion the meantime mode of delivery again provider dependent with counselling with patient  Lazaro Arms 09/03/2021,7:44 AM

## 2021-09-04 DIAGNOSIS — Z3A33 33 weeks gestation of pregnancy: Secondary | ICD-10-CM | POA: Diagnosis not present

## 2021-09-04 DIAGNOSIS — O42113 Preterm premature rupture of membranes, onset of labor more than 24 hours following rupture, third trimester: Secondary | ICD-10-CM | POA: Diagnosis not present

## 2021-09-04 NOTE — Progress Notes (Signed)
Patient ID: Melissa Ayers, female   DOB: 10-May-2000, 21 y.o.   MRN: [id number] ACULTY PRACTICE ANTEPARTUM COMPREHENSIVE PROGRESS NOTE  Melissa Ayers is a 21 y.o. G2P0010 at [redacted]w[redacted]d  who is admitted for PROM.   Fetal presentation is breech. Length of Stay:  2  Days  Subjective: Pt has no complaints this morning Patient reports good fetal movement.  She reports no uterine contractions, no bleeding and no loss of fluid per vagina.  Vitals:  Blood pressure 119/63, pulse 79, temperature 98.3 F (36.8 C), temperature source Oral, resp. rate 16, height 5\' 6"  (1.676 m), weight 91.8 kg, last menstrual period 01/12/2021, SpO2 100 %.  Physical Examination: Lungs clear Heart RRR Abd soft + BS gravid non tender GU deferred Ext non tender  Fetal Monitoring:  Baseline: 130 bpm, Variability: Good {> 6 bpm), and Accelerations: Reactive  Labs:  No results found for this or any previous visit (from the past 24 hour(s)).  Imaging Studies:    NA   Medications:  Scheduled  cephALEXin  500 mg Oral QID   docusate sodium  100 mg Oral Daily   enoxaparin (LOVENOX) injection  40 mg Subcutaneous Q24H   levothyroxine  75 mcg Oral QAC breakfast   prenatal multivitamin  1 tablet Oral Q1200   sodium chloride flush  3 mL Intravenous Q12H   valACYclovir  500 mg Oral BID   I have reviewed the patient's current medications.  ASSESSMENT: IUP 33 4/7 weeks PROM Malpresentation  PLAN: Stable. S/P Mag and BMZ. Continue with latency antibiotics. No S/Sx of infection or PTL. Delivery at 34 weeks or for maternal/fetal indications. Mode of delivery to be determine by OB team at time of delivery Continue routine antenatal care.   6/7 09/04/2021,7:42 AM

## 2021-09-05 DIAGNOSIS — O42913 Preterm premature rupture of membranes, unspecified as to length of time between rupture and onset of labor, third trimester: Secondary | ICD-10-CM | POA: Diagnosis not present

## 2021-09-05 LAB — CBC
HCT: 31.9 % — ABNORMAL LOW (ref 36.0–46.0)
Hemoglobin: 10.9 g/dL — ABNORMAL LOW (ref 12.0–15.0)
MCH: 30.6 pg (ref 26.0–34.0)
MCHC: 34.2 g/dL (ref 30.0–36.0)
MCV: 89.6 fL (ref 80.0–100.0)
Platelets: 217 10*3/uL (ref 150–400)
RBC: 3.56 MIL/uL — ABNORMAL LOW (ref 3.87–5.11)
RDW: 13 % (ref 11.5–15.5)
WBC: 12.4 10*3/uL — ABNORMAL HIGH (ref 4.0–10.5)
nRBC: 0 % (ref 0.0–0.2)

## 2021-09-05 LAB — TYPE AND SCREEN
ABO/RH(D): O POS
Antibody Screen: NEGATIVE

## 2021-09-05 NOTE — Progress Notes (Signed)
FACULTY PRACTICE ANTEPARTUM(COMPREHENSIVE) NOTE  Melissa Ayers is a 21 y.o. G2P0010 with Estimated Date of Delivery: 10/19/21   By  LMP [redacted]w[redacted]d  who is admitted for PPROM.    Fetal presentation is breech. Length of Stay:  3  Days  Date of admission:09/02/2021  Subjective: Resting comfortably, no acute complaints Patient reports the fetal movement as active. Patient reports uterine contraction  activity as occasional back pain. Patient reports  vaginal bleeding as none. Patient describes fluid per vagina as None.  Vitals:  Blood pressure (!) 117/59, pulse 68, temperature 98.2 F (36.8 C), temperature source Oral, resp. rate 16, height 5\' 6"  (1.676 m), weight 91.8 kg, last menstrual period 01/12/2021, SpO2 99 %. Vitals:   09/04/21 1928 09/04/21 2304 09/05/21 0618 09/05/21 0955  BP: 128/62 120/67 127/81 (!) 117/59  Pulse: 77 74 83 68  Resp: 16 16 18 16   Temp: 98.1 F (36.7 C) 98 F (36.7 C) 98.2 F (36.8 C) 98.2 F (36.8 C)  TempSrc: Oral Oral Oral Oral  SpO2: 100% 100% 97% 99%  Weight:      Height:       Physical Examination:  General appearance - alert, well appearing, and in no distress Mental status - normal mood, behavior, speech, dress, motor activity, and thought processes Chest - clear to auscultation, no wheezes, rales or rhonchi, symmetric air entry Heart - normal rate and regular rhythm Abdomen - gravid, soft and non-tender Extremities - no edema, no calf tenderss Skin - warm and dry  Fetal Monitoring:  Baseline: 150 bpm, Variability: moderate variability, Accelerations: +15x15 accels, and Decelerations: Absent   reactive  Labs:  Results for orders placed or performed during the hospital encounter of 09/02/21 (from the past 24 hour(s))  CBC   Collection Time: 09/05/21 11:06 AM  Result Value Ref Range   WBC 12.4 (H) 4.0 - 10.5 K/uL   RBC 3.56 (L) 3.87 - 5.11 MIL/uL   Hemoglobin 10.9 (L) 12.0 - 15.0 g/dL   HCT 31.9 (L) 36.0 - 46.0 %   MCV 89.6 80.0 - 100.0 fL    MCH 30.6 26.0 - 34.0 pg   MCHC 34.2 30.0 - 36.0 g/dL   RDW 13.0 11.5 - 15.5 %   Platelets 217 150 - 400 K/uL   nRBC 0.0 0.0 - 0.2 %  Type and screen [place]   Collection Time: 09/05/21 11:06 AM  Result Value Ref Range   ABO/RH(D) PENDING    Antibody Screen PENDING    Sample Expiration      09/08/2021,2359 Performed at [facility], [address], [place], [place] [address]     Imaging Studies:    [place] MFM FETAL BPP WO NON STRESS  Result Date: 09/02/2021 ----------------------------------------------------------------------  OBSTETRICS REPORT                       (Signed Final 09/02/2021 04:32 pm) ---------------------------------------------------------------------- Patient Info  ID #:       [id number]                          D.O.B.:  2000/08/30 (21 yrs)  Name:       Melissa Ayers                   Visit Date: 09/02/2021 12:02 pm ---------------------------------------------------------------------- Performed By  Attending:        Sander Nephew      Ref.  Address:     [address]                    MD                                                             [place], [place]  Performed By:     Germain Osgood            [facility]                    [place]  Referred By:      Florian Buff                    MD ---------------------------------------------------------------------- Orders  #  Description                           Code        Ordered By  1  [place] MFM OB COMP + 14 WK                76805.01    [place]  2  [place] MFM FETAL BPP WO NON               76819.01    LUTHER EURE     STRESS ----------------------------------------------------------------------  #  Order #                     Accession #                Episode #  1  [id number]                   [id number]                 [id number]  2  [id number]                    [id number]                 [id number] ---------------------------------------------------------------------- Indications  Premature rupture of membranes - leaking       O42.90  fluid  Decreased fetal movements, third trimester,    O36.8130  unspecified  [redacted] weeks gestation of pregnancy                Z3A.33  Hypothyroid                                    O99.280 E03.9 ---------------------------------------------------------------------- Fetal Evaluation  Num  Of Fetuses:         1  Fetal Heart Rate(bpm):  145  Cardiac Activity:       Observed  Presentation:           Breech  Placenta:               Anterior  P. Cord Insertion:      Not well visualized  Amniotic Fluid  AFI FV:      Within normal limits  AFI Sum(cm)     %Tile       Largest Pocket(cm)  8.86            10          3.69  RUQ(cm)       RLQ(cm)       LUQ(cm)        LLQ(cm)  3.69          0.61          2.03           2.53 ---------------------------------------------------------------------- Biophysical Evaluation  Amniotic F.V:   Within normal limits       F. Tone:        Observed  F. Movement:    Observed                   Score:          6/8  F. Breathing:   Not Observed ---------------------------------------------------------------------- Biometry  BPD:     89.45  mm     G. Age:  36w 1d         98  %    CI:        84.26   %    70 - 86                                                          FL/HC:      20.4   %    19.9 - 21.5  HC:    307.11   mm     G. Age:  34w 2d         37  %    HC/AC:      1.09        0.96 - 1.11  AC:    281.78   mm     G. Age:  32w 2d         22  %    FL/BPD:     70.0   %    71 - 87  FL:       62.6  mm     G. Age:  32w 3d         18  %    FL/AC:      22.2   %    20 - 24  HUM:      55.8  mm     G. Age:  32w 3d         41  %  CER:      42.4  mm     G. Age:  33w 4d         34  %  LV:        3.5  mm  CM:  7.9  mm  Est. FW:    2056  gm      4 lb 9 oz     28  % ----------------------------------------------------------------------  OB History  Gravidity:    2          SAB:   1  Living:       0 ---------------------------------------------------------------------- Gestational Age  LMP:           33w 2d        Date:  01/12/21                 EDD:   10/19/21  U/S Today:     33w 6d                                        EDD:   10/15/21  Best:          33w 2d     Det. By:  LMP  (01/12/21)          EDD:   10/19/21 ---------------------------------------------------------------------- Anatomy  Cranium:               Appears normal         Aortic Arch:            Not well visualized  Cavum:                 Appears normal         Ductal Arch:            Not well visualized  Ventricles:            Appears normal         Diaphragm:              Appears normal  Choroid Plexus:        Not well visualized    Stomach:                Appears normal, left                                                                        sided  Cerebellum:            Appears normal         Abdomen:                Appears normal  Posterior Fossa:       Appears normal         Abdominal Wall:         Not well visualized  Nuchal Fold:           Not applicable (>20    Cord Vessels:           Not well visualized                         wks GA)  Face:                  Appears normal         Kidneys:  Appear normal                         (orbits and profile)  Lips:                  Not well visualized    Bladder:                Appears normal  Thoracic:              Appears normal         Spine:                  Not well visualized  Heart:                 Appears normal         Upper Extremities:      Not well visualized                         (4CH, axis, and                         situs)  RVOT:                  Not well visualized    Lower Extremities:      Not well visualized  LVOT:                  Not well visualized  Other:  Technicallly difficult due to advanced GA and maternal habitus. ----------------------------------------------------------------------  Cervix Uterus Adnexa  Cervix  Not visualized (advanced GA >24wks)  Uterus  No abnormality visualized.  Right Ovary  Not visualized.  Left Ovary  Not visualized.  Cul De Sac  No free fluid seen.  Adnexa  No adnexal mass visualized. ---------------------------------------------------------------------- Impression  Single intrauterine pregnancy here for a detailed anatomy  due to  suspected premature rupture of membranes (  Amniosure +)  Normal anatomy with measurements consistent with dates  There is good fetal movement and amniotic fluid volume  Suboptimal views of the fetal anatomy were obtained  secondary to fetal position.  Biophysical profile 6/8  Consider NST given 6/8 BPP ---------------------------------------------------------------------- Recommendations  Management per inpatient providers. ----------------------------------------------------------------------              Sander Nephew, MD Electronically Signed Final Report   09/02/2021 04:32 pm ----------------------------------------------------------------------  [place] MFM OB COMP + 14 WK  Result Date: 09/02/2021 ----------------------------------------------------------------------  OBSTETRICS REPORT                       (Signed Final 09/02/2021 04:32 pm) ---------------------------------------------------------------------- Patient Info  ID #:       [id number]                          D.O.B.:  12/27/2000 (21 yrs)  Name:       Melissa Ayers                   Visit Date: 09/02/2021 12:02 pm ---------------------------------------------------------------------- Performed By  Attending:        Sander Nephew      Ref. Address:     [address]                    [place]  [place], [place]  Performed By:     Germain Osgood            [facility]                    [place]  Referred By:      Florian Buff                    MD ---------------------------------------------------------------------- Orders  #  Description                           Code        Ordered By  1  [place] MFM OB COMP + 14 WK                76805.01    [place]  2  [place] MFM FETAL BPP WO NON               76819.01    LUTHER EURE     STRESS ----------------------------------------------------------------------  #  Order #                     Accession #                Episode #  1  [id number]                   [id number]                 [id number]  2  [id number]                   [id number]                 [id number] ---------------------------------------------------------------------- Indications  Premature rupture of membranes - leaking       O42.90  fluid  Decreased fetal movements, third trimester,    O36.8130  unspecified  [redacted] weeks gestation of pregnancy                Z3A.33  Hypothyroid                                    O99.280 E03.9 ---------------------------------------------------------------------- Fetal Evaluation  Num Of Fetuses:         1  Fetal Heart Rate(bpm):  145  Cardiac Activity:       Observed  Presentation:           Breech  Placenta:               Anterior  P. Cord Insertion:      Not well visualized  Amniotic Fluid  AFI FV:      Within normal limits  AFI Sum(cm)     %  Tile       Largest Pocket(cm)  8.86            10          3.69  RUQ(cm)       RLQ(cm)       LUQ(cm)        LLQ(cm)  3.69          0.61          2.03           2.53 ---------------------------------------------------------------------- Biophysical Evaluation  Amniotic F.V:   Within normal limits       F. Tone:        Observed  F. Movement:    Observed                   Score:          6/8  F. Breathing:   Not Observed ---------------------------------------------------------------------- Biometry  BPD:     89.45  mm     G. Age:  36w 1d         98  %    CI:        84.26   %    70 - 86                                                           FL/HC:      20.4   %    19.9 - 21.5  HC:    307.11   mm     G. Age:  34w 2d         37  %    HC/AC:      1.09        0.96 - 1.11  AC:    281.78   mm     G. Age:  32w 2d         22  %    FL/BPD:     70.0   %    71 - 87  FL:       62.6  mm     G. Age:  32w 3d         18  %    FL/AC:      22.2   %    20 - 24  HUM:      55.8  mm     G. Age:  32w 3d         41  %  CER:      42.4  mm     G. Age:  33w 4d         34  %  LV:        3.5  mm  CM:        7.9  mm  Est. FW:    2056  gm      4 lb 9 oz     28  % ---------------------------------------------------------------------- OB History  Gravidity:    2          SAB:   1  Living:       0 ---------------------------------------------------------------------- Gestational Age  LMP:           33w 2d        Date:  01/12/21  EDD:   10/19/21  U/S Today:     33w 6d                                        EDD:   10/15/21  Best:          33w 2d     Det. By:  LMP  (01/12/21)          EDD:   10/19/21 ---------------------------------------------------------------------- Anatomy  Cranium:               Appears normal         Aortic Arch:            Not well visualized  Cavum:                 Appears normal         Ductal Arch:            Not well visualized  Ventricles:            Appears normal         Diaphragm:              Appears normal  Choroid Plexus:        Not well visualized    Stomach:                Appears normal, left                                                                        sided  Cerebellum:            Appears normal         Abdomen:                Appears normal  Posterior Fossa:       Appears normal         Abdominal Wall:         Not well visualized  Nuchal Fold:           Not applicable ([id number]    Cord Vessels:           Not well visualized                         wks GA)  Face:                  Appears normal         Kidneys:                Appear normal                         (orbits and profile)  Lips:                  Not well  visualized    Bladder:                Appears normal  Thoracic:              Appears normal  Spine:                  Not well visualized  Heart:                 Appears normal         Upper Extremities:      Not well visualized                         (4CH, axis, and                         situs)  RVOT:                  Not well visualized    Lower Extremities:      Not well visualized  LVOT:                  Not well visualized  Other:  Technicallly difficult due to advanced GA and maternal habitus. ---------------------------------------------------------------------- Cervix Uterus Adnexa  Cervix  Not visualized (advanced GA >24wks)  Uterus  No abnormality visualized.  Right Ovary  Not visualized.  Left Ovary  Not visualized.  Cul De Sac  No free fluid seen.  Adnexa  No adnexal mass visualized. ---------------------------------------------------------------------- Impression  Single intrauterine pregnancy here for a detailed anatomy  due to  suspected premature rupture of membranes (  Amniosure +)  Normal anatomy with measurements consistent with dates  There is good fetal movement and amniotic fluid volume  Suboptimal views of the fetal anatomy were obtained  secondary to fetal position.  Biophysical profile 6/8  Consider NST given 6/8 BPP ---------------------------------------------------------------------- Recommendations  Management per inpatient providers. ----------------------------------------------------------------------              Sander Nephew, MD Electronically Signed Final Report   09/02/2021 04:32 pm ----------------------------------------------------------------------    Medications:  Scheduled  cephALEXin  500 mg Oral QID   docusate sodium  100 mg Oral Daily   enoxaparin (LOVENOX) injection  40 mg Subcutaneous Q24H   levothyroxine  75 mcg Oral QAC breakfast   prenatal multivitamin  1 tablet Oral Q1200   sodium chloride flush  3 mL Intravenous Q12H   valACYclovir  500 mg  Oral BID   I have reviewed the patient's current medications.  ASSESSMENT: G2P0010 [redacted]w[redacted]d Estimated Date of Delivery: 10/19/21  Patient Active Problem List   Diagnosis Date Noted   PROM (premature rupture of membranes) 09/02/2021   Hypothyroidism 05/31/2021   Encounter for supervision of normal pregnancy, antepartum 04/11/2021   Abnormal Pap smear of cervix 12/06/2020   Genital herpes 04/06/2017    PLAN: 21yo G2P0010@[redacted]w[redacted]d  admitted for PPROM -FWB- Cat. I, reassuring Continue twice daily monitoring  -PPROM No evidence of infection  S/p BMZ and Magensium Currently on latency antibiotics Plan for delivery at 34wks Initially had discussed potential for ECV; however, due to low fluid volume would advise against proceeding.  Will recheck fluid on Tuesday. -Mode of delivery to be determined by OB team at time of delivery  -Maternal care Continue synthroid daily Tylenol prn On valtrex for suppression  Janyth Pupa, DO Attending [place], [place] for [place], [place]  .

## 2021-09-06 DIAGNOSIS — O42111 Preterm premature rupture of membranes, onset of labor more than 24 hours following rupture, first trimester: Secondary | ICD-10-CM

## 2021-09-06 DIAGNOSIS — O42119 Preterm premature rupture of membranes, onset of labor more than 24 hours following rupture, unspecified trimester: Secondary | ICD-10-CM

## 2021-09-06 NOTE — Progress Notes (Signed)
FACULTY PRACTICE ANTEPARTUM(COMPREHENSIVE) NOTE  Melissa Ayers is a 21 y.o. G2P0010 with Estimated Date of Delivery: 10/19/21   By  LMP [redacted]w[redacted]d  who is admitted for PPROM.    Fetal presentation is breech. Length of Stay:  4  Days  Date of admission:09/02/2021  Subjective: Resting comfortably, no acute complaints Patient reports the fetal movement as active. Patient reports uterine contraction  activity as none. Patient reports  vaginal bleeding as none. Patient describes fluid per vagina as occasional leak  Vitals:  Blood pressure 126/73, pulse 83, temperature 98.3 F (36.8 C), temperature source Oral, resp. rate 18, height 5\' 6"  (1.676 m), weight 91.8 kg, last menstrual period 01/12/2021, SpO2 98 %. Vitals:   09/05/21 2001 09/06/21 0001 09/06/21 0624 09/06/21 0827  BP: 128/74 123/63 119/64 126/73  Pulse: 89 84 87 83  Resp: 18 18 18 18   Temp: 97.9 F (36.6 C) 98.3 F (36.8 C) 98.3 F (36.8 C) 98.3 F (36.8 C)  TempSrc: Oral Oral Oral Oral  SpO2: 99% 97% 97% 98%  Weight:      Height:       Physical Examination:  General appearance - alert, well appearing, and in no distress Mental status - normal mood, behavior, speech, dress, motor activity, and thought processes Chest - CTAB Heart - normal rate and regular rhythm Abdomen - gravid, soft and non-tender Extremities - no edema, no calf tenderness bilaterally Skin - warm and dry  Fetal Monitoring:  Baseline: 150 bpm, Variability: moderate, Accelerations: present 15x15 accels, and Decelerations: Absent   reactive  Labs:  No results found for this or any previous visit (from the past 24 hour(s)).  Imaging Bedside 09/08/21- Complete breech, AFI <5, FHT 150bpm  Medications:  Scheduled  cephALEXin  500 mg Oral QID   docusate sodium  100 mg Oral Daily   enoxaparin (LOVENOX) injection  40 mg Subcutaneous Q24H   levothyroxine  75 mcg Oral QAC breakfast   prenatal multivitamin  1 tablet Oral Q1200   sodium chloride flush  3 mL  Intravenous Q12H   valACYclovir  500 mg Oral BID   I have reviewed the patient's current medications.  ASSESSMENT: G2P0010 [redacted]w[redacted]d Estimated Date of Delivery: 10/19/21  Patient Active Problem List   Diagnosis Date Noted   PROM (premature rupture of membranes) 09/02/2021   Hypothyroidism 05/31/2021   Encounter for supervision of normal pregnancy, antepartum 04/11/2021   Abnormal Pap smear of cervix 12/06/2020   Genital herpes 04/06/2017    PLAN: 1) FWB- reassuring Reactive NST, continue twice daily monitoring  2) PPROM -no evidence of infection -s/p BMZ -completing latency antibiotics  Discussed management including continuing pregnancy past 34wks as she does not show any signs of infection.  Pt desires to proceed with delivery Based on bedside 12/08/2020, concern for oligohydramnios due to PPROM and would not recommend ECV.   -Discussed plan for scheduled C-section tomorrow due to breech presentation. Risk benefits and alternatives of cesarean section were discussed with the patient including but not limited to infection, bleeding, damage to bowel , bladder and baby with the need for further surgery. Pt voiced understanding and desires to proceed as scheduled.  04/08/2017, DO Attending Obstetrician & Gynecologist, [facility] for [facility] [place], [facility]

## 2021-09-07 ENCOUNTER — Inpatient Hospital Stay (HOSPITAL_COMMUNITY): Payer: Medicaid Other

## 2021-09-07 ENCOUNTER — Encounter (HOSPITAL_COMMUNITY)
Admission: AD | Disposition: A | Discharge status: Home / Self Care | Payer: Self-pay | Source: Home / Self Care | Attending: Obstetrics & Gynecology

## 2021-09-07 ENCOUNTER — Inpatient Hospital Stay (HOSPITAL_COMMUNITY): Payer: Medicaid Other | Admitting: Anesthesiology

## 2021-09-07 ENCOUNTER — Encounter (HOSPITAL_COMMUNITY): Payer: Self-pay | Admitting: Obstetrics & Gynecology

## 2021-09-07 DIAGNOSIS — O42013 Preterm premature rupture of membranes, onset of labor within 24 hours of rupture, third trimester: Secondary | ICD-10-CM

## 2021-09-07 DIAGNOSIS — O321XX0 Maternal care for breech presentation, not applicable or unspecified: Secondary | ICD-10-CM

## 2021-09-07 DIAGNOSIS — Z3A34 34 weeks gestation of pregnancy: Secondary | ICD-10-CM

## 2021-09-07 LAB — CBC
HCT: 33.6 % — ABNORMAL LOW (ref 36.0–46.0)
Hemoglobin: 11.7 g/dL — ABNORMAL LOW (ref 12.0–15.0)
MCH: 30.9 pg (ref 26.0–34.0)
MCHC: 34.8 g/dL (ref 30.0–36.0)
MCV: 88.7 fL (ref 80.0–100.0)
Platelets: 234 10*3/uL (ref 150–400)
RBC: 3.79 MIL/uL — ABNORMAL LOW (ref 3.87–5.11)
RDW: 13.1 % (ref 11.5–15.5)
WBC: 12.7 10*3/uL — ABNORMAL HIGH (ref 4.0–10.5)
nRBC: 0 % (ref 0.0–0.2)

## 2021-09-07 LAB — TYPE AND SCREEN
ABO/RH(D): O POS
Antibody Screen: NEGATIVE

## 2021-09-07 SURGERY — Surgical Case
Anesthesia: Spinal

## 2021-09-07 MED ORDER — SODIUM CHLORIDE 0.9% FLUSH: 3.0000 mL | INTRAVENOUS | Status: DC | PRN

## 2021-09-07 MED ORDER — ONDANSETRON HCL 4 MG/2ML IJ SOLN
INTRAMUSCULAR | Status: DC | PRN
  Administered 2021-09-07: 4 mg via INTRAVENOUS

## 2021-09-07 MED ORDER — DEXAMETHASONE SODIUM PHOSPHATE 4 MG/ML IJ SOLN
INTRAMUSCULAR | Status: DC | PRN
  Administered 2021-09-07: 4 mg via INTRAVENOUS

## 2021-09-07 MED ORDER — DEXAMETHASONE SODIUM PHOSPHATE 4 MG/ML IJ SOLN
INTRAMUSCULAR | Status: AC
  Filled 2021-09-07: qty 1

## 2021-09-07 MED ORDER — SENNOSIDES-DOCUSATE SODIUM 8.6-50 MG PO TABS
2.0000 | ORAL_TABLET | Freq: Every day | ORAL | Status: DC
  Administered 2021-09-08 – 2021-09-09 (×2): 2 via ORAL
  Filled 2021-09-07 (×2): qty 2

## 2021-09-07 MED ORDER — WITCH HAZEL-GLYCERIN EX PADS: 1.0000 | MEDICATED_PAD | CUTANEOUS | Status: DC | PRN

## 2021-09-07 MED ORDER — OXYTOCIN-SODIUM CHLORIDE 30-0.9 UT/500ML-% IV SOLN
INTRAVENOUS | Status: DC | PRN
  Administered 2021-09-07: 400 mL via INTRAVENOUS

## 2021-09-07 MED ORDER — ACETAMINOPHEN 500 MG PO TABS
1000.0000 mg | ORAL_TABLET | Freq: Four times a day (QID) | ORAL | Status: AC
  Administered 2021-09-07 – 2021-09-08 (×4): 1000 mg via ORAL
  Filled 2021-09-07 (×4): qty 2

## 2021-09-07 MED ORDER — ONDANSETRON HCL 4 MG/2ML IJ SOLN: 4.0000 mg | Freq: Three times a day (TID) | INTRAMUSCULAR | Status: DC | PRN

## 2021-09-07 MED ORDER — KETOROLAC TROMETHAMINE 30 MG/ML IJ SOLN
30.0000 mg | Freq: Once | INTRAMUSCULAR | Status: AC | PRN
  Administered 2021-09-07: 30 mg via INTRAVENOUS

## 2021-09-07 MED ORDER — SODIUM CHLORIDE 0.9 % IV SOLN
500.0000 mg | INTRAVENOUS | Status: AC
  Administered 2021-09-07: 500 mg via INTRAVENOUS

## 2021-09-07 MED ORDER — MAGNESIUM HYDROXIDE 400 MG/5ML PO SUSP: 30.0000 mL | ORAL | Status: DC | PRN

## 2021-09-07 MED ORDER — TETANUS-DIPHTH-ACELL PERTUSSIS 5-2.5-18.5 LF-MCG/0.5 IM SUSY: 0.5000 mL | PREFILLED_SYRINGE | Freq: Once | INTRAMUSCULAR | Status: DC

## 2021-09-07 MED ORDER — BUPIVACAINE IN DEXTROSE 0.75-8.25 % IT SOLN
INTRATHECAL | Status: DC | PRN
  Administered 2021-09-07: 1.8 mL via INTRATHECAL

## 2021-09-07 MED ORDER — MORPHINE SULFATE (PF) 0.5 MG/ML IJ SOLN
INTRAMUSCULAR | Status: DC | PRN
  Administered 2021-09-07: .15 mg via INTRATHECAL

## 2021-09-07 MED ORDER — OXYCODONE HCL 5 MG PO TABS: 5.0000 mg | ORAL_TABLET | ORAL | Status: DC | PRN

## 2021-09-07 MED ORDER — KETOROLAC TROMETHAMINE 30 MG/ML IJ SOLN: 30.0000 mg | Freq: Four times a day (QID) | INTRAMUSCULAR | Status: AC | PRN

## 2021-09-07 MED ORDER — PHENYLEPHRINE HCL-NACL 20-0.9 MG/250ML-% IV SOLN
INTRAVENOUS | Status: DC | PRN
  Administered 2021-09-07: 60 ug/min via INTRAVENOUS

## 2021-09-07 MED ORDER — MEDROXYPROGESTERONE ACETATE 150 MG/ML IM SUSP: 150.0000 mg | INTRAMUSCULAR | Status: DC | PRN

## 2021-09-07 MED ORDER — SCOPOLAMINE 1 MG/3DAYS TD PT72
MEDICATED_PATCH | TRANSDERMAL | Status: AC
  Filled 2021-09-07: qty 1

## 2021-09-07 MED ORDER — GENTAMICIN SULFATE 40 MG/ML IJ SOLN
5.0000 mg/kg | INTRAVENOUS | Status: AC
  Administered 2021-09-07: 360 mg via INTRAVENOUS
  Filled 2021-09-07: qty 9

## 2021-09-07 MED ORDER — COCONUT OIL OIL: 1.0000 | TOPICAL_OIL | Status: DC | PRN

## 2021-09-07 MED ORDER — FENTANYL CITRATE (PF) 100 MCG/2ML IJ SOLN
INTRAMUSCULAR | Status: AC
  Filled 2021-09-07: qty 2

## 2021-09-07 MED ORDER — SIMETHICONE 80 MG PO CHEW: 80.0000 mg | CHEWABLE_TABLET | ORAL | Status: DC | PRN

## 2021-09-07 MED ORDER — SODIUM CHLORIDE 0.9 % IV SOLN
INTRAVENOUS | Status: AC
  Filled 2021-09-07: qty 5

## 2021-09-07 MED ORDER — MEASLES, MUMPS & RUBELLA VAC IJ SOLR: 0.5000 mL | Freq: Once | INTRAMUSCULAR | Status: DC

## 2021-09-07 MED ORDER — GABAPENTIN 100 MG PO CAPS
200.0000 mg | ORAL_CAPSULE | Freq: Every day | ORAL | Status: DC
  Administered 2021-09-07 – 2021-09-08 (×2): 200 mg via ORAL
  Filled 2021-09-07 (×2): qty 2

## 2021-09-07 MED ORDER — ENOXAPARIN SODIUM 40 MG/0.4ML IJ SOSY
40.0000 mg | PREFILLED_SYRINGE | INTRAMUSCULAR | Status: DC
  Administered 2021-09-08 – 2021-09-09 (×2): 40 mg via SUBCUTANEOUS
  Filled 2021-09-07 (×2): qty 0.4

## 2021-09-07 MED ORDER — LACTATED RINGERS IV SOLN: INTRAVENOUS | Status: DC

## 2021-09-07 MED ORDER — ZOLPIDEM TARTRATE 5 MG PO TABS: 5.0000 mg | ORAL_TABLET | Freq: Every evening | ORAL | Status: DC | PRN

## 2021-09-07 MED ORDER — FENTANYL CITRATE (PF) 100 MCG/2ML IJ SOLN
INTRAMUSCULAR | Status: DC | PRN
  Administered 2021-09-07: 15 ug via INTRATHECAL

## 2021-09-07 MED ORDER — PRENATAL MULTIVITAMIN CH
1.0000 | ORAL_TABLET | Freq: Every day | ORAL | Status: DC
  Administered 2021-09-08: 1 via ORAL
  Filled 2021-09-07: qty 1

## 2021-09-07 MED ORDER — DIPHENHYDRAMINE HCL 25 MG PO CAPS: 25.0000 mg | ORAL_CAPSULE | Freq: Four times a day (QID) | ORAL | Status: DC | PRN

## 2021-09-07 MED ORDER — PHENYLEPHRINE HCL-NACL 20-0.9 MG/250ML-% IV SOLN
INTRAVENOUS | Status: AC
  Filled 2021-09-07: qty 250

## 2021-09-07 MED ORDER — ONDANSETRON HCL 4 MG/2ML IJ SOLN
INTRAMUSCULAR | Status: AC
  Filled 2021-09-07: qty 2

## 2021-09-07 MED ORDER — MORPHINE SULFATE (PF) 0.5 MG/ML IJ SOLN
INTRAMUSCULAR | Status: AC
  Filled 2021-09-07: qty 10

## 2021-09-07 MED ORDER — SCOPOLAMINE 1 MG/3DAYS TD PT72
1.0000 | MEDICATED_PATCH | Freq: Once | TRANSDERMAL | Status: DC
  Administered 2021-09-07: 1.5 mg via TRANSDERMAL

## 2021-09-07 MED ORDER — SIMETHICONE 80 MG PO CHEW
80.0000 mg | CHEWABLE_TABLET | Freq: Three times a day (TID) | ORAL | Status: DC
  Administered 2021-09-08 – 2021-09-09 (×3): 80 mg via ORAL
  Filled 2021-09-07 (×5): qty 1

## 2021-09-07 MED ORDER — OXYTOCIN-SODIUM CHLORIDE 30-0.9 UT/500ML-% IV SOLN: 2.5000 [IU]/h | INTRAVENOUS | Status: AC

## 2021-09-07 MED ORDER — DIPHENHYDRAMINE HCL 25 MG PO CAPS: 25.0000 mg | ORAL_CAPSULE | ORAL | Status: DC | PRN

## 2021-09-07 MED ORDER — DIPHENHYDRAMINE HCL 50 MG/ML IJ SOLN
25.0000 mg | Freq: Once | INTRAMUSCULAR | Status: AC
Start: 2021-09-07 — End: 2021-09-07
  Administered 2021-09-07: 25 mg via INTRAVENOUS
  Filled 2021-09-07: qty 1

## 2021-09-07 MED ORDER — NALOXONE HCL 4 MG/10ML IJ SOLN: 1.0000 ug/kg/h | INTRAVENOUS | Status: DC | PRN

## 2021-09-07 MED ORDER — METHYLERGONOVINE MALEATE 0.2 MG/ML IJ SOLN
INTRAMUSCULAR | Status: AC
  Filled 2021-09-07: qty 1

## 2021-09-07 MED ORDER — NALOXONE HCL 0.4 MG/ML IJ SOLN: 0.4000 mg | INTRAMUSCULAR | Status: DC | PRN

## 2021-09-07 MED ORDER — FENTANYL CITRATE (PF) 100 MCG/2ML IJ SOLN: 25.0000 ug | INTRAMUSCULAR | Status: DC | PRN

## 2021-09-07 MED ORDER — VANCOMYCIN HCL IN DEXTROSE 1-5 GM/200ML-% IV SOLN
1000.0000 mg | Freq: Once | INTRAVENOUS | Status: AC
Start: 2021-09-07 — End: 2021-09-07
  Administered 2021-09-07: 1000 mg via INTRAVENOUS
  Filled 2021-09-07: qty 200

## 2021-09-07 MED ORDER — KETOROLAC TROMETHAMINE 30 MG/ML IJ SOLN
30.0000 mg | Freq: Four times a day (QID) | INTRAMUSCULAR | Status: AC
  Administered 2021-09-08 (×4): 30 mg via INTRAVENOUS
  Filled 2021-09-07 (×4): qty 1

## 2021-09-07 MED ORDER — DIPHENHYDRAMINE HCL 50 MG/ML IJ SOLN: 12.5000 mg | INTRAMUSCULAR | Status: DC | PRN

## 2021-09-07 MED ORDER — SOD CITRATE-CITRIC ACID 500-334 MG/5ML PO SOLN
30.0000 mL | Freq: Once | ORAL | Status: AC
  Administered 2021-09-07: 30 mL via ORAL
  Filled 2021-09-07: qty 30

## 2021-09-07 MED ORDER — METHYLERGONOVINE MALEATE 0.2 MG/ML IJ SOLN
INTRAMUSCULAR | Status: DC | PRN
  Administered 2021-09-07: .2 mg via INTRAMUSCULAR

## 2021-09-07 MED ORDER — DIBUCAINE (PERIANAL) 1 % EX OINT: 1.0000 | TOPICAL_OINTMENT | CUTANEOUS | Status: DC | PRN

## 2021-09-07 MED ORDER — KETOROLAC TROMETHAMINE 30 MG/ML IJ SOLN
INTRAMUSCULAR | Status: AC
  Filled 2021-09-07: qty 1

## 2021-09-07 MED ORDER — OXYTOCIN-SODIUM CHLORIDE 30-0.9 UT/500ML-% IV SOLN
INTRAVENOUS | Status: AC
  Filled 2021-09-07: qty 500

## 2021-09-07 MED ORDER — LACTATED RINGERS IV SOLN: INTRAVENOUS | Status: DC | PRN

## 2021-09-07 MED ORDER — MENTHOL 3 MG MT LOZG: 1.0000 | LOZENGE | OROMUCOSAL | Status: DC | PRN

## 2021-09-07 MED ORDER — ACETAMINOPHEN 500 MG PO TABS: 1000.0000 mg | ORAL_TABLET | Freq: Four times a day (QID) | ORAL | Status: DC

## 2021-09-07 MED ORDER — DIPHENHYDRAMINE HCL 50 MG/ML IJ SOLN
INTRAMUSCULAR | Status: AC
  Filled 2021-09-07: qty 1

## 2021-09-07 MED ORDER — IBUPROFEN 600 MG PO TABS
600.0000 mg | ORAL_TABLET | Freq: Four times a day (QID) | ORAL | Status: DC
  Administered 2021-09-09: 600 mg via ORAL
  Filled 2021-09-07: qty 1

## 2021-09-07 MED ORDER — DIPHENHYDRAMINE HCL 50 MG/ML IJ SOLN
INTRAMUSCULAR | Status: DC | PRN
  Administered 2021-09-07 (×2): 12.5 mg via INTRAVENOUS

## 2021-09-07 SURGICAL SUPPLY — 35 items
APL PRP STRL LF DISP 70% ISPRP (MISCELLANEOUS) ×2
APL SKNCLS STERI-STRIP NONHPOA (GAUZE/BANDAGES/DRESSINGS) ×1
BENZOIN TINCTURE PRP APPL 2/3 (GAUZE/BANDAGES/DRESSINGS) IMPLANT
CHLORAPREP W/TINT 26 (MISCELLANEOUS) ×2 IMPLANT
CLAMP UMBILICAL CORD (MISCELLANEOUS) ×1 IMPLANT
CLOTH BEACON ORANGE TIMEOUT ST (SAFETY) ×1 IMPLANT
DRSG OPSITE POSTOP 4X10 (GAUZE/BANDAGES/DRESSINGS) ×1 IMPLANT
ELECT REM PT RETURN 9FT ADLT (ELECTROSURGICAL) ×1
ELECTRODE REM PT RTRN 9FT ADLT (ELECTROSURGICAL) ×1 IMPLANT
EXTRACTOR VACUUM KIWI (MISCELLANEOUS) IMPLANT
GLOVE SURG ORTHO 8.0 STRL STRW (GLOVE) ×1 IMPLANT
GOWN STRL REUS W/TWL LRG LVL3 (GOWN DISPOSABLE) ×2 IMPLANT
HEMOSTAT ARISTA ABSORB 3G PWDR (HEMOSTASIS) IMPLANT
KIT ABG SYR 3ML LUER SLIP (SYRINGE) IMPLANT
NDL HYPO 25X5/8 SAFETYGLIDE (NEEDLE) IMPLANT
NEEDLE HYPO 25X5/8 SAFETYGLIDE (NEEDLE) ×1 IMPLANT
NS IRRIG 1000ML POUR BTL (IV SOLUTION) ×1 IMPLANT
PACK C SECTION WH (CUSTOM PROCEDURE TRAY) ×1 IMPLANT
PAD OB MATERNITY 4.3X12.25 (PERSONAL CARE ITEMS) ×1 IMPLANT
RTRCTR C-SECT PINK 25CM LRG (MISCELLANEOUS) IMPLANT
STRIP CLOSURE SKIN 1/2X4 (GAUZE/BANDAGES/DRESSINGS) IMPLANT
SUT MON AB-0 CT1 36 (SUTURE) ×2 IMPLANT
SUT PLAIN 0 NONE (SUTURE) IMPLANT
SUT PLAIN 2 0 (SUTURE) ×1
SUT PLAIN ABS 2-0 CT1 27XMFL (SUTURE) IMPLANT
SUT VIC AB 0 CT1 27 (SUTURE) ×2
SUT VIC AB 0 CT1 27XBRD ANBCTR (SUTURE) ×2 IMPLANT
SUT VIC AB 2-0 CT1 27 (SUTURE) ×1
SUT VIC AB 2-0 CT1 TAPERPNT 27 (SUTURE) ×1 IMPLANT
SUT VIC AB 4-0 KS 27 (SUTURE) IMPLANT
SUT VIC AB 4-0 SH 27 (SUTURE) ×1
SUT VIC AB 4-0 SH 27XANBCTRL (SUTURE) ×1 IMPLANT
TOWEL OR 17X24 6PK STRL BLUE (TOWEL DISPOSABLE) ×1 IMPLANT
TRAY FOLEY W/BAG SLVR 14FR LF (SET/KITS/TRAYS/PACK) ×1 IMPLANT
WATER STERILE IRR 1000ML POUR (IV SOLUTION) ×1 IMPLANT

## 2021-09-07 NOTE — Progress Notes (Signed)
Pt brought upstairs to OR for scheduled cesarean section.

## 2021-09-07 NOTE — Lactation Note (Signed)
This note was copied from a baby's chart. Lactation Consultation Note  Patient Name: Melissa Ayers Date: 09/07/2021   Age:21 hours  Lactation orders received and acknowledged; [name] still in PACU at this time; NICU LC to follow up tomorrow for initial assessment.   Gideon Burstein S Banjamin Stovall 09/07/2021, 6:02 PM

## 2021-09-07 NOTE — Op Note (Signed)
[name] PROCEDURE DATE: 09/07/2021  PREOPERATIVE DIAGNOSES: Intrauterine pregnancy at [redacted]w[redacted]d weeks gestation; malpresentation: Breech  POSTOPERATIVE DIAGNOSES: The same, viable infant delivered  PROCEDURE: Primary Low Transverse Cesarean Section  SURGEON:  Dr. Mariel Aloe  ASSISTANT:  Myrtie Hawk, DO An experienced assistant was required given the standard of surgical care given the complexity of the case.  This assistant was needed for exposure, dissection, suctioning, retraction, instrument exchange, assisting with delivery with administration of fundal pressure, and for overall help during the procedure.  ANESTHESIOLOGY TEAM: Anesthesiologist: Elmer Picker, MD CRNA: Trellis Paganini, CRNA  INDICATIONS: Melissa Ayers is a 20 y.o. [id number] at [redacted]w[redacted]d here for cesarean section secondary to the indications listed under preoperative diagnoses; please see preoperative note for further details.  The risks of surgery were discussed with the patient including but were not limited to: bleeding which may require transfusion or reoperation; infection which may require antibiotics; injury to bowel, bladder, ureters or other surrounding organs; injury to the fetus; need for additional procedures including hysterectomy in the event of a life-threatening hemorrhage; formation of adhesions; placental abnormalities wth subsequent pregnancies; incisional problems; thromboembolic phenomenon and other postoperative/anesthesia complications.  The patient concurred with the proposed plan, giving informed written consent for the procedure.    FINDINGS:  Viable female infant in cephalic presentation.  Apgars 4 and 9.  Amniotic fluid: clear.  Intact placenta, three vessel cord.  Normal uterus, fallopian tubes and ovaries bilaterally.  ANESTHESIA: spinal INTRAVENOUS FLUIDS: 2030 ml   ESTIMATED BLOOD LOSS: 1032 ml URINE OUTPUT:  50 ml SPECIMENS: Placenta sent to L&D and arterial cord gas  collected COMPLICATIONS: None immediate  PROCEDURE IN DETAIL:  The patient preoperatively received intravenous antibiotics and had sequential compression devices applied to her lower extremities.  She was then taken to the operating room where spinal anesthesia was found to be adequate. She was then placed in a dorsal supine position with a leftward tilt, and prepped and draped in a sterile manner.  A foley catheter was  placed into her bladder and attached to constant gravity.  After an adequate timeout was performed, a Pfannenstiel skin incision was made with scalpel and carried through to the underlying layer of fascia. The fascia was incised in the midline, and this incision was extended with curved mayo scissors. The rectus muscles were separated in the midline and the peritoneum was entered with mayo scissors.   The Alexis self-retaining retractor was introduced into the abdominal cavity.  Attention was turned to the lower uterine segment where a low transverse hysterotomy was made with a scalpel and extended bluntly.  The infant was successfully delivered, the cord was clamped and cut immediately, and the infant was handed over to the awaiting neonatology team. Uterine massage was then administered, and the placenta delivered intact with a three-vessel cord. The uterus was cleared of clots and debris.  The hysterotomy was closed with 0 Vicryl in a running fashion.  Figure-of-eight 0 Vicryl serosal stitches were placed to help with hemostasis.    The pelvis was cleared of all clot and debris. Hemostasis was confirmed on all surfaces. The incision once again inspected and hemostatic. The retractor was removed.  The peritoneum was closed with a 2-0 Vicryl running stitch. The fascia was then closed using 0 Vicryl in a running fashion.  The subcutaneous layer was irrigated,  re-approximated with 2-0 vicryl, and was found to be hemostatic, and any areas of bleeding were cauterized with the bovie. The skin  was closed with a 4-0 keith needle subcuticular stitch. The patient tolerated the procedure well. Sponge, instrument and needle counts were correct x 3.  She was taken to the recovery room in stable condition.

## 2021-09-07 NOTE — Progress Notes (Signed)
Bedside ultrasound performed.  Breech presentation again confirmed.   The risks of surgery were discussed with the patient including but were not limited to: bleeding which may require transfusion or reoperation; infection which may require antibiotics; injury to bowel, bladder, ureters or other surrounding organs; injury to the fetus; need for additional procedures including hysterectomy in the event of a life-threatening hemorrhage; formation of adhesions; placental abnormalities wth subsequent pregnancies; incisional problems; thromboembolic phenomenon and other postoperative/anesthesia complications.  The patient concurred with the proposed plan, giving informed written consent for the procedure.   Patient has been NPO since the previous evening. she will remain NPO for procedure. Anesthesia and OR aware. Preoperative prophylactic antibiotics and SCDs ordered on call to the OR.  To OR when ready.  Mariel Aloe, MD

## 2021-09-07 NOTE — Anesthesia Preprocedure Evaluation (Signed)
Anesthesia Evaluation    Reviewed: Allergy & Precautions, Patient's Chart, lab work & pertinent test results  Airway Mallampati: II  TM Distance: >3 FB Neck ROM: Full    Dental no notable dental hx.    Pulmonary neg pulmonary ROS, former smoker,    Pulmonary exam normal breath sounds clear to auscultation       Cardiovascular negative cardio ROS Normal cardiovascular exam Rhythm:Regular Rate:Normal     Neuro/Psych PSYCHIATRIC DISORDERS Depression negative neurological ROS     GI/Hepatic negative GI ROS, Neg liver ROS,   Endo/Other  Hypothyroidism   Renal/GU negative Renal ROS  negative genitourinary   Musculoskeletal negative musculoskeletal ROS (+)   Abdominal   Peds  Hematology negative hematology ROS (+)   Anesthesia Other Findings Primary C/S for breech and PPROM  Reproductive/Obstetrics (+) Pregnancy                             Anesthesia Physical Anesthesia Plan  ASA: 2  Anesthesia Plan: Spinal   Post-op Pain Management:    Induction:   PONV Risk Score and Plan: Treatment may vary due to age or medical condition  Airway Management Planned: Natural Airway  Additional Equipment:   Intra-op Plan:   Post-operative Plan:   Informed Consent: I have reviewed the patients History and Physical, chart, labs and discussed the procedure including the risks, benefits and alternatives for the proposed anesthesia with the patient or authorized representative who has indicated his/her understanding and acceptance.     Dental advisory given  Plan Discussed with: CRNA  Anesthesia Plan Comments:         Anesthesia Quick Evaluation

## 2021-09-07 NOTE — Discharge Summary (Signed)
Postpartum Discharge Summary  Date of Service updated-8/18     Patient Name: Melissa Ayers DOB: Oct 28, 2000 MRN: [id number]  Date of admission: 09/02/2021 Delivery date:09/07/2021  Delivering provider: Shelda Pal  Date of discharge: 09/09/2021  Admitting diagnosis: PROM (premature rupture of membranes) [O42.90] Breech presentation [O32.1XX0] Intrauterine pregnancy: [redacted]w[redacted]d    Secondary diagnosis:  Principal Problem:   PROM (premature rupture of membranes) Active Problems:   Breech presentation  Additional problems: N/A    Discharge diagnosis: Term Pregnancy Delivered                                              Post partum procedures: none Augmentation:  none Complications: None  Hospital course: Sceduled C/S   21y.o. yo G2P0111 at 375w0das admitted to the hospital 09/02/2021 for scheduled cesarean section with the following indication:Malpresentation.Delivery details are as follows:  Membrane Rupture Time/Date: 3:41 PM ,09/07/2021   Delivery Method:C-Section, Low Transverse  Details of operation can be found in separate operative note.  Patient had an uncomplicated postpartum course.  She is ambulating, tolerating a regular diet, passing flatus, and urinating well. Patient is discharged home in stable condition on  09/09/21        Newborn Data: Birth date:09/07/2021  Birth time:3:41 PM  Gender:Female  Living status:Living  Apgars:4 ,9  Weight:2220 g     Magnesium Sulfate received: No BMZ received: Yes Rhophylac:N/A MMR:N/A T-DaP: declined Flu: N/A Transfusion:No  Physical exam  Vitals:   09/08/21 2049 09/09/21 0030 09/09/21 0539 09/09/21 0902  BP: 127/62 (!) 117/59 121/68   Pulse: 86 86 83   Resp: 18 16 18 18   Temp: 97.7 F (36.5 C) 97.8 F (36.6 C) 97.8 F (36.6 C) 97.9 F (36.6 C)  TempSrc: Oral Oral Oral Oral  SpO2: 99% 98% 100% 99%  Weight:      Height:       General: alert, cooperative, and no distress Lochia: appropriate Uterine Fundus:  firm Incision: Dressing is clean, dry, and intact DVT Evaluation: No evidence of DVT seen on physical exam. Labs: Lab Results  Component Value Date   WBC 14.5 (H) 09/08/2021   HGB 9.0 (L) 09/08/2021   HCT 25.9 (L) 09/08/2021   MCV 88.7 09/08/2021   PLT 193 09/08/2021       No data to display         Edinburgh Score:    09/09/2021    3:00 AM  Edinburgh Postnatal Depression Scale Screening Tool  I have been able to laugh and see the funny side of things. 0  I have looked forward with enjoyment to things. 0  I have blamed myself unnecessarily when things went wrong. 0  I have been anxious or worried for no good reason. 0  I have felt scared or panicky for no good reason. 0  Things have been getting on top of me. 0  I have been so unhappy that I have had difficulty sleeping. 0  I have felt sad or miserable. 0  I have been so unhappy that I have been crying. 0  The thought of harming myself has occurred to me. 0  Edinburgh Postnatal Depression Scale Total 0     After visit meds:  Allergies as of 09/09/2021       Reactions   Penicillins Rash  Medication List     STOP taking these medications    aspirin 81 MG chewable tablet       TAKE these medications    acetaminophen 325 MG tablet Commonly known as: TYLENOL Take 2 tablets (650 mg total) by mouth every 4 (four) hours as needed (for pain scale < 4  OR  temperature  >/=  100.5 F).   Blood Pressure Monitor Misc For regular home bp monitoring during pregnancy   ibuprofen 600 MG tablet Commonly known as: ADVIL Take 1 tablet (600 mg total) by mouth every 6 (six) hours.   levothyroxine 75 MCG tablet Commonly known as: SYNTHROID Take 75 mcg by mouth daily.   oxyCODONE 5 MG immediate release tablet Commonly known as: Oxy IR/ROXICODONE Take 1 tablet (5 mg total) by mouth every 6 (six) hours as needed for up to 7 days for moderate pain.   PRENATAL VITAMIN PO Take by mouth.   valACYclovir 500 MG  tablet Commonly known as: VALTREX Take 500 mg by mouth daily.         Discharge home in stable condition Infant Feeding:  in NICU Infant Disposition:NICU Discharge instruction: per After Visit Summary and Postpartum booklet. Activity: Advance as tolerated. Pelvic rest for 6 weeks.  Diet: routine diet Future Appointments: Future Appointments  Date Time Provider [place]  09/15/2021  1:30 PM Christin Fudge, CNM [facility]  10/20/2021  1:30 PM Cresenzo-Dishmon, Joaquim Lai, CNM [facility]   Follow up Visit:  Follow-up Information     [facility]. Go in 1 week(s).   Specialty: Obstetrics and Gynecology Why: Follow up in 1 wk to check incision Contact information: [address] [place]                Message sent to [facility] on 09/09/21 by Annalee Genta, DO Please schedule this patient for a In person postpartum visit in 6 weeks with the following provider: Any provider. Additional Postpartum F/U:Incision check 1 week  Low risk pregnancy complicated by:  Breech presentation Delivery mode:  C-Section, Low Transverse  Anticipated Birth Control:  Unsure   Janyth Pupa, DO Attending [place], [place] for [employer], [place]

## 2021-09-07 NOTE — Anesthesia Procedure Notes (Signed)
Spinal  Patient location during procedure: OR Start time: 09/07/2021 3:04 PM End time: 09/07/2021 3:06 PM Reason for block: surgical anesthesia Staffing Performed: anesthesiologist  Anesthesiologist: Elmer Picker, MD Performed by: Elmer Picker, MD Authorized by: Elmer Picker, MD   Preanesthetic Checklist Completed: patient identified, IV checked, risks and benefits discussed, surgical consent, monitors and equipment checked, pre-op evaluation and timeout performed Spinal Block Patient position: sitting Prep: DuraPrep and site prepped and draped Patient monitoring: cardiac monitor, continuous pulse ox and blood pressure Approach: midline Location: L3-4 Injection technique: single-shot Needle Needle type: Pencan  Needle gauge: 24 G Needle length: 9 cm Assessment Sensory level: T6 Events: CSF return Additional Notes Functioning IV was confirmed and monitors were applied. Sterile prep and drape, including hand hygiene and sterile gloves were used. The patient was positioned and the spine was prepped. The skin was anesthetized with lidocaine.  Free flow of clear CSF was obtained prior to injecting local anesthetic into the CSF.  The spinal needle aspirated freely following injection.  The needle was carefully withdrawn.  The patient tolerated the procedure well.

## 2021-09-07 NOTE — Transfer of Care (Signed)
Immediate Anesthesia Transfer of Care Note  Patient: [name]  Procedure(s) Performed: CESAREAN SECTION  Patient Location: PACU  Anesthesia Type:Spinal  Level of Consciousness: awake, alert  and patient cooperative  Airway & Oxygen Therapy: Patient Spontanous Breathing  Post-op Assessment: Report given to RN and Post -op Vital signs reviewed and stable  Post vital signs: Reviewed and stable  Last Vitals:  Vitals Value Taken Time  BP 159/75 09/07/21 1645  Temp    Pulse 86 09/07/21 1645  Resp 17 09/07/21 1645  SpO2 100 % 09/07/21 1645  Vitals shown include unvalidated device data.  Last Pain:  Vitals:   09/07/21 1420  TempSrc:   PainSc: 0-No pain      Patients Stated Pain Goal: 3 (09/05/21 0859)  Complications: No notable events documented.

## 2021-09-08 LAB — CBC
HCT: 25.9 % — ABNORMAL LOW (ref 36.0–46.0)
Hemoglobin: 9 g/dL — ABNORMAL LOW (ref 12.0–15.0)
MCH: 30.8 pg (ref 26.0–34.0)
MCHC: 34.7 g/dL (ref 30.0–36.0)
MCV: 88.7 fL (ref 80.0–100.0)
Platelets: 193 10*3/uL (ref 150–400)
RBC: 2.92 MIL/uL — ABNORMAL LOW (ref 3.87–5.11)
RDW: 13 % (ref 11.5–15.5)
WBC: 14.5 10*3/uL — ABNORMAL HIGH (ref 4.0–10.5)
nRBC: 0 % (ref 0.0–0.2)

## 2021-09-08 MED ORDER — LACTATED RINGERS IV BOLUS
1000.0000 mL | Freq: Once | INTRAVENOUS | Status: AC
  Administered 2021-09-08: 1000 mL via INTRAVENOUS

## 2021-09-08 NOTE — Lactation Note (Signed)
This note was copied from a baby's chart.  NICU Lactation Consultation Note  Patient Name: Melissa Ayers [id number] Date: 09/08/2021 Age:23 hours   Subjective Reason for consult: Initial assessment; Primapara; 1st time breastfeeding; NICU baby; Late-preterm 34-36.6wks  Lactation observed Ms. Mcgovern pump both breasts on the NICU unit. I reviewed pump settings, assembly, cleaning and disassembly of pump parts. She is expressing measurable volumes at this time. I verified her breast milk labels and showed her how to label the storage bottle.  I brought in a hands free band to use as a pumping top. I also brought in some cleaning supplies.  I recommended pumping 8+ times a day in 24 hours for 15-20 minutes.  Personal pump status is not known. Lactation should verified personal pump status upon next visit.  Objective Infant data: Mother's Current Feeding Choice: Breast Milk and Donor Milk  Infant feeding assessment Scale for Readiness: 3  Maternal data: G2P0111  C-Section, Low Transverse Significant Breast History:: positive breast changes in pregnancy  Current breast feeding challenges:: NICU  Does the patient have breastfeeding experience prior to this delivery?: No  Pumping frequency: pumped 2 times in first 22 hours; recommended pumping 8+ times a day, q2-3 hours Pumped volume: 20 mL  Pump:  (need to verify)  Assessment Maternal: Milk volume: Normal   Intervention/Plan Interventions: Breast feeding basics reviewed; Education; DEBP  Tools: Pump; Hands-free pumping top Pump Education: Setup, frequency, and cleaning  Plan: Consult Status: NICU follow-up  NICU Follow-up type: New admission follow up    Walker Shadow 09/08/2021, 1:50 PM

## 2021-09-08 NOTE — Clinical Social Work Maternal (Signed)
CLINICAL SOCIAL WORK MATERNAL/CHILD NOTE  Patient Details  Name: Melissa Ayers MRN: [id number] Date of Birth: 06/15/2000  Date:  10-Aug-2021  Clinical Social Worker Initiating Note:  Laurey Arrow Date/Time: Initiated:  09/08/21/1258     Child's Name:  Melissa Ayers Done   Biological Parents:  Mother, Father Obie Kallenbach 11/06/96)   Need for Interpreter:  None   Reason for Referral:      Address:  [place] [address]    Phone number:  [phone] (home) [phone] (work)    Additional phone number:   Household Members/Support Persons (HM/SP):       HM/SP Name Relationship DOB or Age  HM/SP -1        HM/SP -2        HM/SP -3        HM/SP -4        HM/SP -5        HM/SP -6        HM/SP -7        HM/SP -8          Natural Supports (not living in the home):  Immediate Family, Extended Family, Spouse/significant other, Parent (Per the couple FOB's family will also provide supports when needed.)   Professional Supports: None   Employment: Part-time   Type of Work: Educational psychologist at [employer]   Education:  [place] arranged:    Museum/gallery curator Resources:  Medicaid   Other Resources:  Physicist, medical  , [place] Considerations Which May Impact Care:  None reported  Strengths:  Home prepared for child  , Ability to meet basic needs  , Pediatrician chosen   Psychotropic Medications:         Pediatrician:       Pediatrician List:   [place]      Pediatrician Fax Number:    Risk Factors/Current Problems:  Mental Health Concerns     Cognitive State:  Able to Concentrate  , Alert  , Goal Oriented  , Insightful  , Linear Thinking     Mood/Affect:  Comfortable  , Interested  , Happy  , Calm  , Relaxed     CSW Assessment: CSW met with [name] and FOB at infant's bedside in room 323  When CSW arrived, they  couple was observing infant while he was asleep in his isolette; everyone appeared happy and comfortable. CSW explained CSW's role and [name] gave CSW permission for FOB to remain in the room while CSW completed the clinical assessment.  However, when CSW started to assess for [name]'s MH with [name]'s permission CSW asked FOB to step outside of the room.  The couple appeared to be supportive of one another and they both expressed happiness about being new parents. [name] was polite, easy to engage, and receptive to meeting with CSW.   CSW asked about [name]'s MH hx.  [name] openly shared being dx with anx/dep in high school. Per [name] she is not currently taking any medications and she feels "pretty good."  CSW provided education regarding the baby blues period vs. perinatal mood disorders, discussed treatment and gave resources for mental health follow up if concerns arise.  CSW recommends self-evaluation during the postpartum time period using the New Mom Checklist from Postpartum Progress and encouraged [name] to contact a medical  professional if symptoms are noted at any time.  [name] presented with insight and awareness and did not display any acute MH symptoms. CSW assessed for safety and [name] denied SI, HI, and DV.  [name] expressed feeling comfortable seeking help if additional help is needed. [name] identified MGM and PGM has the primary supporters.    Per the couple they feel well informed by NICU team and they denied having any questions or concerns about infant's health. They denied barriers to visiting with infant post [name]'s discharge. They both communicated having all essential items to care for infant post discharge.    CSW provided review of Sudden Infant Death Syndrome (SIDS) precautions.     [name] declined weekly visits from [place] however agreed to reach out to CSW if a need arise. CSW provided [name] with CSW contact information.   CSW Plan/Description:  Psychosocial Support and Ongoing Assessment of Needs, Sudden Infant  Death Syndrome (SIDS) Education, Perinatal Mood and Anxiety Disorder (PMADs) Education, Other Patient/Family Education, Other Information/Referral to [employer], MSW, [employer] Clinical Social Work [phone]   Dimple Nanas, LCSW 09/08/2021, 1:00 PM

## 2021-09-09 ENCOUNTER — Encounter (HOSPITAL_COMMUNITY): Payer: Self-pay | Admitting: Obstetrics and Gynecology

## 2021-09-09 ENCOUNTER — Other Ambulatory Visit (HOSPITAL_COMMUNITY): Payer: Self-pay

## 2021-09-09 MED ORDER — ACETAMINOPHEN 325 MG PO TABS: 650.0000 mg | ORAL_TABLET | ORAL | Status: AC | PRN

## 2021-09-09 MED ORDER — IBUPROFEN 600 MG PO TABS
600.0000 mg | ORAL_TABLET | Freq: Four times a day (QID) | ORAL | 0 refills | Status: DC
  Filled 2021-09-09: qty 30, 8d supply, fill #0

## 2021-09-09 MED ORDER — OXYCODONE HCL 5 MG PO TABS
5.0000 mg | ORAL_TABLET | Freq: Four times a day (QID) | ORAL | 0 refills | Status: AC | PRN
  Filled 2021-09-09: qty 15, 4d supply, fill #0

## 2021-09-09 NOTE — Anesthesia Postprocedure Evaluation (Signed)
Anesthesia Post Note  Patient: Melissa Ayers  Procedure(s) Performed: CESAREAN SECTION     Patient location during evaluation: PACU Anesthesia Type: Spinal Level of consciousness: oriented and awake and alert Pain management: pain level controlled Vital Signs Assessment: post-procedure vital signs reviewed and stable Respiratory status: spontaneous breathing, respiratory function stable and patient connected to nasal cannula oxygen Cardiovascular status: blood pressure returned to baseline and stable Postop Assessment: no headache, no backache and no apparent nausea or vomiting Anesthetic complications: no   No notable events documented.  Last Vitals:  Vitals:   09/09/21 0539 09/09/21 0902  BP: 121/68 135/82  Pulse: 83 84  Resp: 18 18  Temp: 36.6 C 36.6 C  SpO2: 100% 99%    Last Pain:  Vitals:   09/09/21 0902  TempSrc: Oral  PainSc:                  Raylei Losurdo L Harley Mccartney

## 2021-09-09 NOTE — Lactation Note (Signed)
This note was copied from a baby's chart.  NICU Lactation Consultation Note  Patient Name: Melissa Ayers [id number] Date: 09/09/2021 Age:23 hours   Subjective Reason for consult: Follow-up assessment Ms. Millman did not pump during the night but intends on resuming pumping today. We reviewed volume norms on day 2.   Objective Infant data: Mother's Current Feeding Choice: Breast Milk and Donor Milk  Infant feeding assessment Scale for Readiness: 2     Maternal data: G2P0111  C-Section, Low Transverse Significant Breast History:: positive breast changes in pregnancy  Current breast feeding challenges:: NICU   Does the patient have breastfeeding experience prior to this delivery?: No  Pumping frequency: drops as expected on day 2. Pumped volume: 20 mL    Elvie pump for at-home use  Assessment Infant:  Maternal: Milk volume: Normal   Intervention/Plan Interventions: Education  Tools: 54F feeding tube / Syringe Pump Education: Setup, frequency, and cleaning  Plan: Consult Status: NICU follow-up  NICU Follow-up type: Verify absence of engorgement; Maternal D/C visit; Verify onset of copious milk  Pump q3h and bring EBM to NICU.  Elder Negus 09/09/2021, 9:03 AM

## 2021-09-09 NOTE — Plan of Care (Signed)
Problem: Education: Goal: Knowledge of General Education information will improve Description: Including pain rating scale, medication(s)/side effects and non-pharmacologic comfort measures Outcome: Adequate for Discharge   Problem: Health Behavior/Discharge Planning: Goal: Ability to manage health-related needs will improve Outcome: Adequate for Discharge   Problem: Clinical Measurements: Goal: Ability to maintain clinical measurements within normal limits will improve Outcome: Adequate for Discharge Goal: Will remain free from infection Outcome: Adequate for Discharge Goal: Diagnostic test results will improve Outcome: Adequate for Discharge Goal: Respiratory complications will improve Outcome: Adequate for Discharge Goal: Cardiovascular complication will be avoided Outcome: Adequate for Discharge   Problem: Activity: Goal: Risk for activity intolerance will decrease Outcome: Adequate for Discharge   Problem: Nutrition: Goal: Adequate nutrition will be maintained Outcome: Adequate for Discharge   Problem: Coping: Goal: Level of anxiety will decrease Outcome: Adequate for Discharge   Problem: Elimination: Goal: Will not experience complications related to bowel motility Outcome: Adequate for Discharge Goal: Will not experience complications related to urinary retention Outcome: Adequate for Discharge   Problem: Pain Managment: Goal: General experience of comfort will improve Outcome: Adequate for Discharge   Problem: Safety: Goal: Ability to remain free from injury will improve Outcome: Adequate for Discharge   Problem: Skin Integrity: Goal: Risk for impaired skin integrity will decrease Outcome: Adequate for Discharge   Problem: Education: Goal: Knowledge of General Education information will improve Description: Including pain rating scale, medication(s)/side effects and non-pharmacologic comfort measures Outcome: Adequate for Discharge   Problem: Health  Behavior/Discharge Planning: Goal: Ability to manage health-related needs will improve Outcome: Adequate for Discharge   Problem: Clinical Measurements: Goal: Ability to maintain clinical measurements within normal limits will improve Outcome: Adequate for Discharge Goal: Will remain free from infection Outcome: Adequate for Discharge Goal: Diagnostic test results will improve Outcome: Adequate for Discharge Goal: Respiratory complications will improve Outcome: Adequate for Discharge Goal: Cardiovascular complication will be avoided Outcome: Adequate for Discharge   Problem: Activity: Goal: Risk for activity intolerance will decrease Outcome: Adequate for Discharge   Problem: Nutrition: Goal: Adequate nutrition will be maintained Outcome: Adequate for Discharge   Problem: Coping: Goal: Level of anxiety will decrease Outcome: Adequate for Discharge   Problem: Elimination: Goal: Will not experience complications related to bowel motility Outcome: Adequate for Discharge Goal: Will not experience complications related to urinary retention Outcome: Adequate for Discharge   Problem: Pain Managment: Goal: General experience of comfort will improve Outcome: Adequate for Discharge   Problem: Safety: Goal: Ability to remain free from injury will improve Outcome: Adequate for Discharge   Problem: Skin Integrity: Goal: Risk for impaired skin integrity will decrease Outcome: Adequate for Discharge   Problem: Education: Goal: Knowledge of the prescribed therapeutic regimen will improve Outcome: Adequate for Discharge Goal: Understanding of sexual limitations or changes related to disease process or condition will improve Outcome: Adequate for Discharge Goal: Individualized Educational Video(s) Outcome: Adequate for Discharge   Problem: Self-Concept: Goal: Communication of feelings regarding changes in body function or appearance will improve Outcome: Adequate for Discharge    Problem: Skin Integrity: Goal: Demonstration of wound healing without infection will improve Outcome: Adequate for Discharge   Problem: Education: Goal: Knowledge of condition will improve Outcome: Adequate for Discharge Goal: Individualized Educational Video(s) Outcome: Adequate for Discharge Goal: Individualized Newborn Educational Video(s) Outcome: Adequate for Discharge   Problem: Activity: Goal: Will verbalize the importance of balancing activity with adequate rest periods Outcome: Adequate for Discharge Goal: Ability to tolerate increased activity will improve Outcome: Adequate for Discharge     Problem: Coping: Goal: Ability to identify and utilize available resources and services will improve Outcome: Adequate for Discharge   Problem: Life Cycle: Goal: Chance of risk for complications during the postpartum period will decrease Outcome: Adequate for Discharge   Problem: Role Relationship: Goal: Ability to demonstrate positive interaction with newborn will improve Outcome: Adequate for Discharge   Problem: Skin Integrity: Goal: Demonstration of wound healing without infection will improve Outcome: Adequate for Discharge   

## 2021-09-10 ENCOUNTER — Ambulatory Visit: Payer: Self-pay

## 2021-09-10 NOTE — Lactation Note (Signed)
This note was copied from a baby's chart.  NICU Lactation Consultation Note  Patient Name: Boy Jodee Wagenaar [id number]'[id number] Date: 09/10/2021 Age:23 hours   Subjective Reason for consult: Breastfeeding assistance Ms. Drahos has not pumped in the past 24 hours. Infant was cuing and latched easily with 72mm shield. I encouraged Ms. Casseus to resume q3h pumping and pre-pump before latching infant.   Objective Infant data: Mother's Current Feeding Choice: Breast Milk  Infant feeding assessment Scale for Readiness: 2    Maternal data: [id number]  C-Section, Low Transverse  Pumping frequency: drops as expected on day 2.  Pump: Personal [name])  Assessment Infant: LATCH Score: 9  Audible swallows and no s/s of stress during feeding. However, parent is likely still producing only colostrum. Sucking/swallowing may be more challenging p onset of copious milk. Referral to SLP should proceed IDF-2.  Maternal: Milk volume: Normal   Intervention/Plan Interventions: Infant Driven Feeding Algorithm education; Education 74mm shield  Plan: Consult Status: NICU follow-up  NICU Follow-up type: Assist with IDF-1 (Mother to pre-pump before breastfeeding); Weekly NICU follow up; Verify absence of engorgement  SLP consult Prepump before bf'ing Pump q3h  Elder Negus 09/10/2021, 3:51 PM

## 2021-09-12 ENCOUNTER — Ambulatory Visit: Payer: Self-pay

## 2021-09-12 NOTE — Lactation Note (Signed)
This note was copied from a baby's chart.  NICU Lactation Consultation Note  Patient Name: Melissa Ayers [id number] Date: 09/12/2021 Age:23 days   Subjective Reason for consult: Follow-up assessment [name] continues to bf and post pump. She is experiencing R-breast fullness that is relieved with bf'ing / pumping. We reviewed strategies to avoid engorgement.   Objective Infant data: Mother's Current Feeding Choice: Breast Milk  Infant feeding assessment Scale for Readiness: 2     Maternal data: [id number]  C-Section, Low Transverse  Pump: Personal Melissa Ayers)   Intervention/Plan Interventions: Education; Visual merchandiser education   Plan: Consult Status: NICU follow-up  NICU Follow-up type: Assist with IDF-2 (Mother does not need to pre-pump before breastfeeding)  Continue bf'ing at feeding times followed by pumping.  Melissa Ayers 09/12/2021, 4:03 PM

## 2021-09-13 ENCOUNTER — Ambulatory Visit: Payer: Medicaid Other | Admitting: Physician Assistant

## 2021-09-13 ENCOUNTER — Encounter: Payer: Medicaid Other | Admitting: Women's Health

## 2021-09-15 ENCOUNTER — Encounter: Payer: Self-pay | Admitting: Advanced Practice Midwife

## 2021-09-15 ENCOUNTER — Ambulatory Visit: Payer: Self-pay

## 2021-09-15 ENCOUNTER — Ambulatory Visit (INDEPENDENT_AMBULATORY_CARE_PROVIDER_SITE_OTHER): Payer: Medicaid Other | Admitting: Advanced Practice Midwife

## 2021-09-15 VITALS — BP 125/75 | HR 99 | Wt 187.0 lb

## 2021-09-15 DIAGNOSIS — Z09 Encounter for follow-up examination after completed treatment for conditions other than malignant neoplasm: Secondary | ICD-10-CM

## 2021-09-15 NOTE — Patient Instructions (Signed)
no

## 2021-09-15 NOTE — Lactation Note (Signed)
This note was copied from a baby's chart.  NICU Lactation Consultation Note  Patient Name: Melissa Ayers OHYWV'P Date: 09/15/2021 Age:21   Subjective Reason for consult: Follow-up assessment; Mother's request; Primapara; 1st time breastfeeding; NICU baby; Late-preterm 34-36.6wks  Lactation conducted routine follow up with Melissa Ayers. She was pumping at time of entry, and we noted copious milk volume. She states that her personal pump is a wearable pump and it does not effectively empty the breast. She is currently enrolled in [facility]. I recommended that she call them to see if she is eligible for a loaner pump.  I recommended that while at home, that she use her wearable for 15 minute sessions q3 hours and to then use the manual pump for 5 minutes/breasts to see if she can fully empty.   Lactation to follow up on 8/25 at 1700 to assist with a feeding.   Objective Infant data: Mother's Current Feeding Choice: Breast Milk  Infant feeding assessment Scale for Readiness: 2 Scale for Quality: 3  Maternal data: [id number]  C-Section, Low Transverse  Pumped volume: 180 mL   Pump: Personal, Hands Free  Assessment Infant: LATCH Score: 10  Maternal: Milk volume: Normal   Intervention/Plan Interventions: Breast feeding basics reviewed; Education; DEBP  Tools: Pump Pump Education: Setup, frequency, and cleaning  Plan: Consult Status: NICU follow-up  NICU Follow-up type: Assist with IDF-1 (Mother to pre-pump before breastfeeding)    Melissa Ayers 09/15/2021, 4:04 PM

## 2021-09-15 NOTE — Progress Notes (Signed)
  HPI: Patient returns for routine postoperative follow-up having undergone PLTCS on 09/07/21 (PPROM, breec).  The patient's immediate postoperative recovery has been unremarkable. Since hospital discharge the patient reports no complaints. Baby still in NICU but doing well. .   Current Outpatient Medications: ibuprofen (ADVIL) 600 MG tablet, Take 1 tablet (600 mg total) by mouth every 6 (six) hours., Disp: 30 tablet, Rfl: 0 levothyroxine (SYNTHROID) 75 MCG tablet, Take 75 mcg by mouth daily., Disp: , Rfl:  Prenatal Vit-Fe Fumarate-FA (PRENATAL VITAMIN PO), Take by mouth., Disp: , Rfl:  acetaminophen (TYLENOL) 325 MG tablet, Take 2 tablets (650 mg total) by mouth every 4 (four) hours as needed (for pain scale < 4  OR  temperature  >/=  100.5 F). (Patient not taking: Reported on 09/15/2021), Disp: , Rfl:  Blood Pressure Monitor MISC, For regular home bp monitoring during pregnancy (Patient not taking: Reported on 09/15/2021), Disp: 1 each, Rfl: 0 oxyCODONE (OXY IR/ROXICODONE) 5 MG immediate release tablet, Take 1 tablet (5 mg total) by mouth every 6 (six) hours as needed for up to 7 days for moderate pain. (Patient not taking: Reported on 09/15/2021), Disp: 15 tablet, Rfl: 0 valACYclovir (VALTREX) 500 MG tablet, Take 500 mg by mouth daily. (Patient not taking: Reported on 09/15/2021), Disp: , Rfl:   No current facility-administered medications for this visit.    Blood pressure 125/75, pulse 99, weight 187 lb (84.8 kg), currently breastfeeding.  Physical Exam: Incision C/D w/o erythema, drainage or odor. Dressing and steri strips removed  Diagnostic Tests: none   Impression:  1 weeks s/p CS, incision looks good There are no diagnoses linked to this encounter.    Plan: No orders of the defined types were placed in this encounter.    Follow up: Next month as scheduled for pp appt  Jacklyn Shell, CNM

## 2021-09-16 ENCOUNTER — Ambulatory Visit: Payer: Self-pay

## 2021-09-16 NOTE — Lactation Note (Signed)
This note was copied from a baby's chart.  NICU Lactation Consultation Note  Patient Name: Melissa Ayers OACZY'S Date: 09/16/2021 Age:23 days   Subjective Reason for consult: Follow-up assessment; Mother's request; Primapara; 1st time breastfeeding; NICU baby; Late-preterm 34-36.6wks  Lactation followed up with birth parent today and assisted with breastfeeding. I showed her how to position her son in football hold. We initially tried to latch without a nipple shield, and he showed strong interest. Ultimately, he latches more consistently at this time with a nipple shield. Rhythmic suckling sequences noted, and he actively fed for the duration of my consult.  Birth parent has obtained a WIC pump, which she is pleased to have. Her personal pump was not effectively emptying her breasts.  Lactation to follow up on 8/27.  Objective Infant data: Mother's Current Feeding Choice: Breast Milk  Infant feeding assessment Scale for Readiness: 2 Scale for Quality: 3   Maternal data: G2P0111  C-Section, Low Transverse  Pumped volume: 180 mL   WIC Program: Yes WIC Referral Sent?: Yes Pump:  (obtained a WIC pump today from [facility])  Assessment Infant: LATCH Score: 9  Maternal: Milk volume: Normal  Intervention/Plan Interventions: Breast feeding basics reviewed; Assisted with latch; Adjust position; Support pillows; Education  Tools: Pump Pump Education: Setup, frequency, and cleaning  Plan: Consult Status: NICU follow-up  NICU Follow-up type: Assist with IDF-2 (Mother does not need to pre-pump before breastfeeding)    Melissa Ayers 09/16/2021, 5:25 PM

## 2021-09-18 ENCOUNTER — Ambulatory Visit: Payer: Self-pay

## 2021-09-18 NOTE — Lactation Note (Signed)
This note was copied from a baby's chart. Lactation Consultation Note  Patient Name: Melissa Ayers RCVEL'F Date: 09/18/2021 Reason for consult: Follow-up assessment;Primapara;1st time breastfeeding;NICU baby;Late-preterm 34-36.6wks Age:23 days  Maternal Data Does the patient have breastfeeding experience prior to this delivery?: No  Lactation followed up with [name]. She is leaving shortly to work on preparing the home for baby. She has been very diligent over the weekend to breastfeed and has been present most of the weekend. She endorses baby latching for 15+ minutes consistently.  We discussed baby's weight, and I educated on LPI status and need for continued supplementation even with strong breastfeeding, until weight is consistently increasing. Parent is amenable to this, if needed, but her first preference is to breastfeed, and if supplementing, she wants it to be with her milk.  I stated that lactation would follow up on Monday.  Feeding Mother's Current Feeding Choice: Breast Milk   Lactation Tools Discussed/Used Pumping frequency: recommended post pumping q3 hours Pumped volume: 180 mL  Interventions Interventions: Breast feeding basics reviewed;Education  Discharge Discharge Education: Outpatient recommendation Pump: [facility] Loaner  Consult Status Consult Status: NICU follow-up Date: 09/18/21 Follow-up type: In-patient    Walker Shadow 09/18/2021, 10:11 AM

## 2021-09-20 ENCOUNTER — Ambulatory Visit: Payer: Self-pay

## 2021-09-20 NOTE — Lactation Note (Signed)
This note was copied from a baby's chart.  NICU Lactation Consultation Note  Patient Name: Melissa Ayers Date: 09/20/2021 Age:23 days  Subjective Reason for consult: Follow-up assessment; Primapara; 1st time breastfeeding; NICU baby; Infant < 6lbs; Late-preterm 74-36.6wks  Visited with mom of 4 28/79 weeks old (adjusted) NICU female, she's a P1 and reports breastfeeding is going well, she has been taking baby to breast consistently using a NS # 20 and also pumping after feedings. Melissa Ayers is taking baby Melissa Ayers home today. Reviewed discharge education, pumping schedule, breastfeeding plan and anticipatory guidelines post-discharge. [name] understands the importance of continuing pumping after feedings at the breast to protect her supply, she'll continue using the NS #20 PRN and put baby to breast on feeding cues. All questions and concerns answered, family aware of LC services and will contact PRN.  Objective Infant data: Mother's Current Feeding Choice: Breast Milk  Infant feeding assessment Scale for Readiness: 1 Scale for Quality: 1  Maternal data: G2P0111  C-Section, Low Transverse Pumping frequency: after feedings at the breast Pumped volume: 180 mL WIC Program: Yes WIC Referral Sent?: Yes Pump: WIC Loaner  Assessment Infant: LATCH Score: 10 (Per RN)  Feeding Status: Ad lib  Maternal: Milk volume: Normal  Intervention/Plan Interventions: Breast feeding basics reviewed; Education; DEBP Tools: Pump Pump Education: Setup, frequency, and cleaning; Milk Storage  Plan: Consult Status: Complete   Melissa Ayers 09/20/2021, 12:32 PM

## 2021-09-28 ENCOUNTER — Encounter: Payer: Medicaid Other | Admitting: Advanced Practice Midwife

## 2021-10-05 ENCOUNTER — Encounter: Payer: Medicaid Other | Admitting: Advanced Practice Midwife

## 2021-10-12 ENCOUNTER — Encounter: Payer: Medicaid Other | Admitting: Advanced Practice Midwife

## 2021-10-19 ENCOUNTER — Encounter: Payer: Medicaid Other | Admitting: Advanced Practice Midwife

## 2021-10-20 ENCOUNTER — Encounter: Payer: Self-pay | Admitting: Advanced Practice Midwife

## 2021-10-20 ENCOUNTER — Ambulatory Visit (INDEPENDENT_AMBULATORY_CARE_PROVIDER_SITE_OTHER): Payer: Medicaid Other | Admitting: Advanced Practice Midwife

## 2021-10-20 MED ORDER — VALACYCLOVIR HCL 1 G PO TABS
500.0000 mg | ORAL_TABLET | Freq: Every day | ORAL | 3 refills | Status: DC
Start: 2021-10-20 — End: 2022-11-16

## 2021-10-20 MED ORDER — NORETHIN-ETH ESTRAD-FE BIPHAS 1 MG-10 MCG / 10 MCG PO TABS: 1.0000 | ORAL_TABLET | Freq: Every day | ORAL | 4 refills | Status: DC

## 2021-10-20 NOTE — Progress Notes (Signed)
Post Partum Visit Note   Chief Complaint:   Postpartum Care  History of Present Illness:   Melissa Ayers is a 21 y.o. [phone] Caucasian female being seen today for a postpartum visit. She is 6 weeks postpartum following a primary cesarean section, low transverse incision at 34 gestational weeks. IOL: No, for Breech, PPROM. Anesthesia: spinal.  Laceration: n/a.  Complications: none. Inpatient contraception: no.   Pregnancy uncomplicated. Tobacco use: no. Substance use disorder: no. Last pap smear: 11/30/18 and results were NILM w/ HRHPV not done. Next pap smear due: 11/30/21 No LMP recorded.  Postpartum course has been uncomplicated. Bleeding  started period 1-2 days ago . Bowel function is normal. Bladder function is normal. Urinary incontinence? No, fecal incontinence? No Patient is sexually active. Last sexual activity: 5 days ago.    Upstream - 10/20/21 1343       Pregnancy Intention Screening   Does the patient want to become pregnant in the next year? No    Does the patient's partner want to become pregnant in the next year? No    Would the patient like to discuss contraceptive options today? Yes      Contraception Wrap Up   Current Method No Contraceptive Precautions    Contraception Counseling Provided Yes            The pregnancy intention screening data noted above was reviewed. Potential methods of contraception were discussed. The patient elected to proceed with No data recorded.  Edinburgh Postpartum Depression Screening: Negative  Edinburgh Postnatal Depression Scale - 10/20/21 1339       Edinburgh Postnatal Depression Scale:  In the Past 7 Days   I have been able to laugh and see the funny side of things. 0    I have looked forward with enjoyment to things. 0    I have blamed myself unnecessarily when things went wrong. 0    I have been anxious or worried for no good reason. 0    I have felt scared or panicky for no good reason. 0    Things have been getting on  top of me. 0    I have been so unhappy that I have had difficulty sleeping. 0    I have felt sad or miserable. 0    I have been so unhappy that I have been crying. 0    The thought of harming myself has occurred to me. 0    Edinburgh Postnatal Depression Scale Total 0            Baby's course has been uncomplicated. Baby is feeding by bottle. Infant has a pediatrician/family doctor? Yes.  Childcare strategy if returning to work/school: yes.  Pt has material needs met for her and baby: Yes.   Review of Systems:   Pertinent items are noted in HPI Denies Abnormal vaginal discharge w/ itching/odor/irritation, headaches, visual changes, shortness of breath, chest pain, abdominal pain, severe nausea/vomiting, or problems with urination or bowel movements. Pertinent History Reviewed:  Reviewed past medical,surgical, obstetrical and family history.  Reviewed problem list, medications and allergies. OB History  Gravida Para Term Preterm AB Living  2 1   1 1 1   SAB IAB Ectopic Multiple Live Births  1     0 1    # Outcome Date GA Lbr Len/2nd Weight Sex Delivery Anes PTL Lv  2 Preterm 09/07/21 [redacted]w[redacted]d 4 lb 14.3 oz (2.22 kg) M CS-LTranv Spinal  LIV     Birth Comments: Grossly  normal  1 SAB 09/14/20 [redacted]w[redacted]d         Physical Assessment:   Vitals:   10/20/21 1340  BP: 118/76  Pulse: 81  Weight: 187 lb (84.8 kg)  Body mass index is 30.18 kg/m.  Objective:  Blood pressure 118/76, pulse 81, weight 187 lb (84.8 kg), not currently breastfeeding.  General:  alert, cooperative, and no distress   Breasts:  negative  Lungs: Normal respiratory effort  Heart:  regular rate and rhythm  Abdomen: soft, non-tender, incision well healed   Vulva:  not evaluated  Vagina: not evaluated  Cervix:  normal  Corpus: Well involuted  Adnexa:  not evaluated  Rectal Exam: no hemorrhoids          No results found for this or any previous visit (from the past 24 hour(s)).  Assessment & Plan:  1)  Postpartum exam 2) 6 wks s/p primary cesarean section, low transverse incision 3) bottle feeding 4) Depression screening 5) Contraception management: wants COCs, Start today  Essential components of care per ACOG recommendations:  1.  Mood and well being:  If positive depression screen, discussed and plan developed.  If using tobacco we discussed reduction/cessation and risk of relapse If current substance abuse, we discussed and referral to local resources was offered.   2. Infant care and feeding:  If breastfeeding, discussed returning to work, pumping, breastfeeding-associated pain, guidance regarding return to fertility while lactating if not using another method. If needed, patient was provided with a letter to be allowed to pump q 2-3hrs to support lactation in a private location with access to a refrigerator to store breastmilk.   Recommended that all caregivers be immunized for flu, pertussis and other preventable communicable diseases If pt does not have material needs met for her/baby, referred to local resources for help obtaining these.  3. Sexuality, contraception and birth spacing Provided guidance regarding sexuality, management of dyspareunia, and resumption of intercourse Discussed avoiding interpregnancy interval <647ms and recommended birth spacing of 18 months  4. Sleep and fatigue Discussed coping options for fatigue and sleep disruption Encouraged family/partner/community support of 4 hrs of uninterrupted sleep to help with mood and fatigue  5. Physical recovery  If pt had a C/S, assessed incisional pain and providing guidance on normal vs prolonged recovery If pt had a laceration, perineal healing and pain reviewed.  If urinary or fecal incontinence, discussed management and referred to PT or uro/gyn if indicated  Patient is safe to resume physical activity. Discussed attainment of healthy weight.  6.  Chronic disease management Discussed pregnancy  complications if any, and their implications for future childbearing and long-term maternal health. Review recommendations for prevention of recurrent pregnancy complications, such as aspirin to reduce risk of preeclampsia not applicable. Pt had GDM: No. If yes, 2hr GTT scheduled: not applicable. Reviewed medications and non-pregnant dosing including consideration of whether pt is breastfeeding using a reliable resource such as LactMed: not applicable Referred for f/u w/ PCP or subspecialist providers as indicated: not applicable  7. Health maintenance Mammogram at [id number] earlier if indicated Pap smears as indicated  Meds:  Meds ordered this encounter  Medications   Norethindrone-Ethinyl Estradiol-Fe Biphas (LO LOESTRIN FE) 1 MG-10 MCG / 10 MCG tablet    Sig: Take 1 tablet by mouth daily.    Dispense:  84 tablet    Refill:  4    Order Specific Question:   Supervising Provider    Answer:   EUTania Ade [[id number]]  Follow-up: No follow-ups on file.   No orders of the defined types were placed in this encounter.      [place] for [employer], [place] [facility] 10/20/2021 2:15 PM

## 2021-12-06 ENCOUNTER — Ambulatory Visit (INDEPENDENT_AMBULATORY_CARE_PROVIDER_SITE_OTHER): Payer: Medicaid Other | Admitting: Adult Health

## 2021-12-06 ENCOUNTER — Ambulatory Visit: Payer: Medicaid Other | Admitting: Adult Health

## 2021-12-06 ENCOUNTER — Encounter: Payer: Self-pay | Admitting: Adult Health

## 2021-12-06 ENCOUNTER — Other Ambulatory Visit (HOSPITAL_COMMUNITY)
Admission: RE | Admit: 2021-12-06 | Discharge: 2021-12-06 | Disposition: A | Discharge status: Home / Self Care | Payer: Medicaid Other | Source: Ambulatory Visit | Attending: Adult Health | Admitting: Adult Health

## 2021-12-06 VITALS — BP 116/77 | HR 94 | Ht 66.0 in | Wt 192.0 lb

## 2021-12-06 DIAGNOSIS — Z3202 Encounter for pregnancy test, result negative: Secondary | ICD-10-CM

## 2021-12-06 DIAGNOSIS — Z01419 Encounter for gynecological examination (general) (routine) without abnormal findings: Secondary | ICD-10-CM | POA: Diagnosis present

## 2021-12-06 DIAGNOSIS — Z30013 Encounter for initial prescription of injectable contraceptive: Secondary | ICD-10-CM | POA: Diagnosis not present

## 2021-12-06 DIAGNOSIS — Z3042 Encounter for surveillance of injectable contraceptive: Secondary | ICD-10-CM

## 2021-12-06 DIAGNOSIS — N941 Unspecified dyspareunia: Secondary | ICD-10-CM | POA: Insufficient documentation

## 2021-12-06 LAB — POCT URINE PREGNANCY: Preg Test, Ur: NEGATIVE

## 2021-12-06 MED ORDER — MEDROXYPROGESTERONE ACETATE 150 MG/ML IM SUSP: 150.0000 mg | INTRAMUSCULAR | 4 refills | Status: DC

## 2021-12-06 MED ORDER — MEDROXYPROGESTERONE ACETATE 150 MG/ML IM SUSP
150.0000 mg | Freq: Once | INTRAMUSCULAR | Status: AC
  Administered 2021-12-06: 150 mg via INTRAMUSCULAR

## 2021-12-06 NOTE — Progress Notes (Signed)
Patient ID: Melissa Ayers, female   DOB: Jun 16, 2000, 21 y.o.   MRN: [id number] History of Present Illness: Melissa Ayers is a 21 year old white female, with DP, [id number], in for a well woman gyn exam and pap. Has been bleeding on and off for 3 months, on lo Loestrin. She says sex hurts at times too. She wants to try depo.   PCP is Dr Phillips Odor.   Current Medications, Allergies, Past Medical History, Past Surgical History, Family History and Social History were reviewed in [facility] record.     Review of Systems: Patient denies any headaches, hearing loss, fatigue, blurred vision, shortness of breath, chest pain, abdominal pain, problems with bowel movements, urination, or intercourse. No joint pain or mood swings.  See HPI for positives.    Physical Exam:BP 116/77 (BP Location: Left Arm, Patient Position: Sitting, Cuff Size: Normal)   Pulse 94   Ht 5\' 6"  (1.676 m)   Wt 192 lb (87.1 kg)   Breastfeeding No   BMI 30.99 kg/m  UPT is negative  General:  Well developed, well nourished, no acute distress Skin:  Warm and dry Neck:  Midline trachea, normal thyroid, good ROM, no lymphadenopathy Lungs; Clear to auscultation bilaterally Breast:  No dominant palpable mass, retraction, or nipple discharge Cardiovascular: Regular rate and rhythm Abdomen:  Soft, non tender, no hepatosplenomegaly Pelvic:  External genitalia is normal in appearance, no lesions.  The vagina is normal in appearance.+period blood. Urethra has no lesions or masses. The cervix is smooth.Pap with GC/CHL and trich performed.  Uterus is felt to be normal size, shape, and contour.  No adnexal masses or tenderness noted.Bladder is non tender, no masses felt. Rectal: Deferred Extremities/musculoskeletal:  No swelling or varicosities noted, no clubbing or cyanosis Psych:  No mood changes, alert and cooperative,seems happy AA is 0 Fall risk is low    12/06/2021    1:54 PM 07/28/2021   10:12 AM 04/12/2021    2:00 PM   Depression screen PHQ 2/9  Decreased Interest 0 0 0  Down, Depressed, Hopeless 0 0 0  PHQ - 2 Score 0 0 0  Altered sleeping 0 0 0  Tired, decreased energy 0 0 0  Change in appetite 0 0 0  Feeling bad or failure about yourself  0 0 0  Trouble concentrating 0 0 0  Moving slowly or fidgety/restless 0 0 0  Suicidal thoughts 0 0 0  PHQ-9 Score 0 0 0       12/06/2021    1:54 PM 07/28/2021   10:12 AM 04/12/2021    2:00 PM 11/29/2020   11:14 AM  GAD 7 : Generalized Anxiety Score  Nervous, Anxious, on Edge 0 0 0 0  Control/stop worrying 0 0 0 0  Worry too much - different things 0 0 0 0  Trouble relaxing 0 0 0 0  Restless 0 0 0 0  Easily annoyed or irritable 0 0 0 0  Afraid - awful might happen 0 0 0 0  Total GAD 7 Score 0 0 0 0      Upstream - 12/06/21 1350       Pregnancy Intention Screening   Does the patient want to become pregnant in the next year? No    Does the patient's partner want to become pregnant in the next year? No    Would the patient like to discuss contraceptive options today? No      Contraception Wrap Up   Current Method  Oral Contraceptive    End Method Hormonal Injection    Contraception Counseling Provided Yes    How was the end contraceptive method provided? Prescription             Examination chaperoned by Tish RN  Impression and plan: 1. Encounter for gynecological examination with Papanicolaou smear of cervix Pap sent Pap in 3 years if normal Physical in 1 year  2. Encounter for initial prescription of injectable contraceptive First depo given in office today and will rx depo provera Meds ordered this encounter  Medications   medroxyPROGESTERone (DEPO-PROVERA) 150 MG/ML injection    Sig: Inject 1 mL (150 mg total) into the muscle every 3 (three) months.    Dispense:  1 mL    Refill:  4    Order Specific Question:   Supervising Provider    Answer:   Elonda Husky, LUTHER H [[id number]]   medroxyPROGESTERone (DEPO-PROVERA) injection 150 mg   Return  in 12 weeks for depo   3. Dyspareunia in female Use good lubricate and increase foreplay  4. Negative pregnancy test

## 2021-12-09 LAB — CYTOLOGY - PAP
Chlamydia: NEGATIVE
Comment: NEGATIVE
Comment: NEGATIVE
Comment: NORMAL
Diagnosis: NEGATIVE
Diagnosis: REACTIVE
Neisseria Gonorrhea: NEGATIVE
Trichomonas: NEGATIVE

## 2022-02-28 ENCOUNTER — Ambulatory Visit (INDEPENDENT_AMBULATORY_CARE_PROVIDER_SITE_OTHER): Payer: Medicaid Other | Admitting: *Deleted

## 2022-02-28 DIAGNOSIS — Z3042 Encounter for surveillance of injectable contraceptive: Secondary | ICD-10-CM

## 2022-02-28 MED ORDER — MEDROXYPROGESTERONE ACETATE 150 MG/ML IM SUSP
150.0000 mg | Freq: Once | INTRAMUSCULAR | Status: AC
  Administered 2022-02-28: 150 mg via INTRAMUSCULAR

## 2022-02-28 NOTE — Progress Notes (Signed)
   NURSE VISIT- INJECTION  SUBJECTIVE:  Melissa Ayers is a 22 y.o. [phone] female here for a Depo Provera for contraception/period management. She is a GYN patient.   OBJECTIVE:  There were no vitals taken for this visit.  Appears well, in no apparent distress  Injection administered in: Left deltoid  Meds ordered this encounter  Medications   medroxyPROGESTERone (DEPO-PROVERA) injection 150 mg    ASSESSMENT: GYN patient Depo Provera for contraception/period management PLAN: Follow-up: in 11-13 weeks for next Depo   Janece Canterbury  02/28/2022 2:38 PM

## 2022-05-23 ENCOUNTER — Ambulatory Visit (INDEPENDENT_AMBULATORY_CARE_PROVIDER_SITE_OTHER): Payer: Medicaid Other | Admitting: *Deleted

## 2022-05-23 DIAGNOSIS — Z3042 Encounter for surveillance of injectable contraceptive: Secondary | ICD-10-CM

## 2022-05-23 MED ORDER — MEDROXYPROGESTERONE ACETATE 150 MG/ML IM SUSP
150.0000 mg | Freq: Once | INTRAMUSCULAR | Status: AC
  Administered 2022-05-23: 150 mg via INTRAMUSCULAR

## 2022-05-23 NOTE — Progress Notes (Signed)
   NURSE VISIT- INJECTION  SUBJECTIVE:  Melissa Ayers is a 22 y.o. [phone] female here for a Depo Provera for contraception/period management. She is a GYN patient.   OBJECTIVE:  There were no vitals taken for this visit.  Appears well, in no apparent distress  Injection administered in: Right deltoid  Meds ordered this encounter  Medications   medroxyPROGESTERone (DEPO-PROVERA) injection 150 mg    ASSESSMENT: GYN patient Depo Provera for contraception/period management PLAN: Follow-up: in 11-13 weeks for next Depo   Jobe Marker  05/23/2022 2:39 PM

## 2022-08-15 ENCOUNTER — Ambulatory Visit: Payer: Medicaid Other

## 2022-08-15 ENCOUNTER — Ambulatory Visit (INDEPENDENT_AMBULATORY_CARE_PROVIDER_SITE_OTHER): Payer: Medicaid Other | Admitting: Adult Health

## 2022-08-15 ENCOUNTER — Encounter: Payer: Self-pay | Admitting: Adult Health

## 2022-08-15 VITALS — BP 119/77 | HR 96 | Ht 66.0 in | Wt 213.5 lb

## 2022-08-15 DIAGNOSIS — Z30011 Encounter for initial prescription of contraceptive pills: Secondary | ICD-10-CM

## 2022-08-15 DIAGNOSIS — R635 Abnormal weight gain: Secondary | ICD-10-CM | POA: Diagnosis not present

## 2022-08-15 DIAGNOSIS — Z3202 Encounter for pregnancy test, result negative: Secondary | ICD-10-CM | POA: Diagnosis not present

## 2022-08-15 LAB — POCT URINE PREGNANCY: Preg Test, Ur: NEGATIVE

## 2022-08-15 MED ORDER — NORETHIN ACE-ETH ESTRAD-FE 1-20 MG-MCG PO TABS
1.0000 | ORAL_TABLET | Freq: Every day | ORAL | 4 refills | Status: DC
Start: 2022-08-15 — End: 2022-12-07

## 2022-08-15 NOTE — Progress Notes (Signed)
  Subjective:     Patient ID: Melissa Ayers, female   DOB: [birth year], 22 y.o.   MRN: [id number]  HPI Melissa Ayers is a 22 year old white female, with SO, [id number] in complaining of weight gain with depo, has gained about 21 lbs. She wants to try a pill.     Component Value Date/Time   DIAGPAP  12/06/2021 1357    - Negative for Intraepithelial Lesions or Malignancy (NILM)   DIAGPAP - Benign reactive/reparative changes 12/06/2021 1357   DIAGPAP - Low grade squamous intraepithelial lesion (LSIL) (A) 11/29/2020 1104   ADEQPAP  12/06/2021 1357    Satisfactory for evaluation; transformation zone component PRESENT.   ADEQPAP  11/29/2020 1104    Satisfactory for evaluation; transformation zone component PRESENT.    PCP is Dr Phillips Odor  Review of Systems +weight gain with depo Denies MI,stroke,DVT,breast cancer or migraine with aura Reviewed past medical,surgical, social and family history. Reviewed medications and allergies.     Objective:   Physical Exam BP 119/77 (BP Location: Left Arm, Patient Position: Sitting, Cuff Size: Normal)   Pulse 96   Ht 5\' 6"  (1.676 m)   Wt 213 lb 8 oz (96.8 kg)   Breastfeeding No   BMI 34.46 kg/m  UPT is negative  Skin warm and dry.  Lungs: clear to ausculation bilaterally. Cardiovascular: regular rate and rhythm.    Fall risk is low  Upstream - 08/15/22 1514       Pregnancy Intention Screening   Does the patient want to become pregnant in the next year? No    Does the patient's partner want to become pregnant in the next year? No    Would the patient like to discuss contraceptive options today? Yes      Contraception Wrap Up   Current Method Hormonal Injection    End Method Oral Contraceptive    Contraception Counseling Provided Yes    How was the end contraceptive method provided? Prescription             Assessment:     1. Pregnancy examination or test, negative result - POCT urine pregnancy  2. Weight gain +weight gain with depo, about 21  labs Last depo 05/23/22  3. Encounter for initial prescription of contraceptive pills She wants a pill Will rx junel 1/20, can start today and use condoms for 1 pack Meds ordered this encounter  Medications   norethindrone-ethinyl estradiol-FE (LOESTRIN FE) 1-20 MG-MCG tablet    Sig: Take 1 tablet by mouth daily.    Dispense:  84 tablet    Refill:  4    Order Specific Question:   Supervising Provider    Answer:   Lazaro Arms [[id number]]      Plan:     Follow up in 3 months for ROS

## 2022-09-03 IMAGING — US US OB < 14 WEEKS - US OB TV
1 series · 15 of 28 positions shown · non-contrast
Comparison: None

CLINICAL DATA: Spotting in first trimester of pregnancy, LMP
06/25/2020

EXAM:
OBSTETRIC <14 WK US AND TRANSVAGINAL OB US
TECHNIQUE: Both transabdominal and transvaginal ultrasound examinations were
performed for complete evaluation of the gestation as well as the
maternal uterus, adnexal regions, and pelvic cul-de-sac.
Transvaginal technique was performed to assess early pregnancy.

[Series 1: us ob < 14 weeks - us ob tv · 89 acquisitions, 15 frames shown]
[im 1/89]
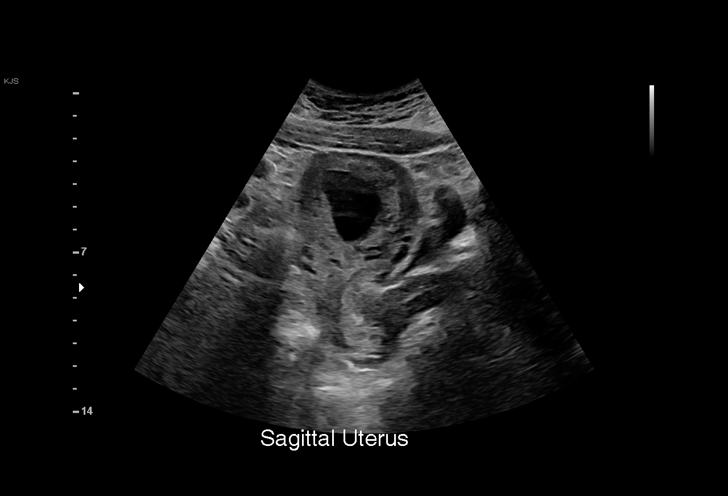
[im 7/89]
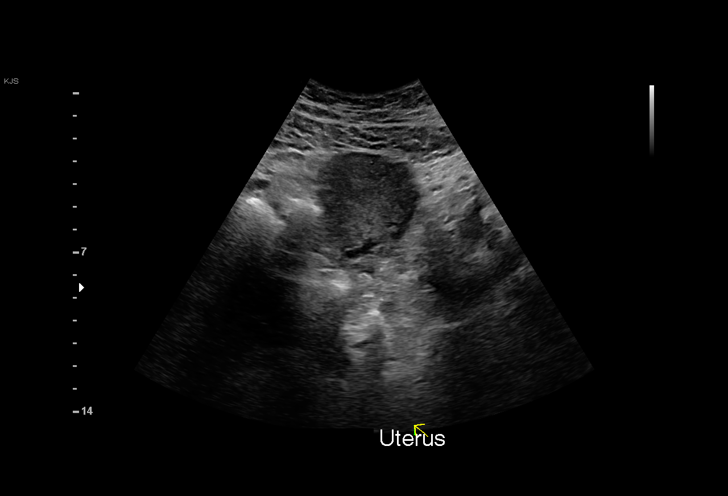
[im 14/89]
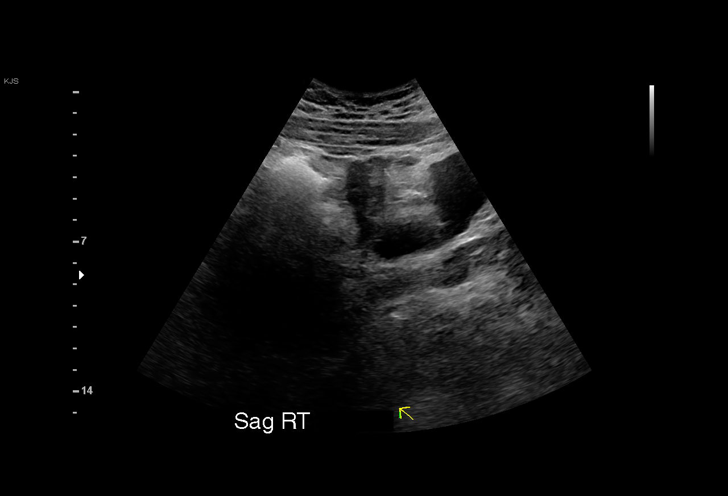
[im 20/89]
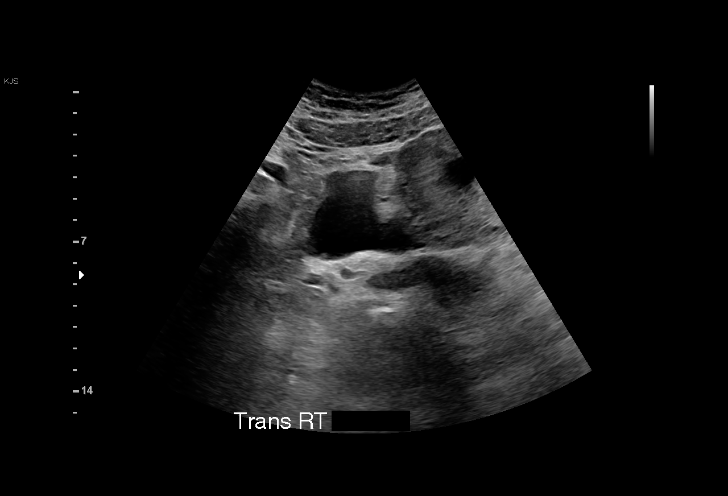
[im 27/89]
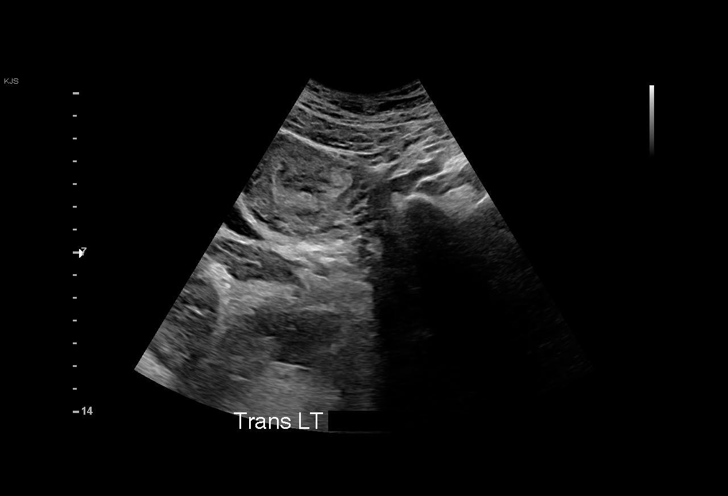
[im 33/89]
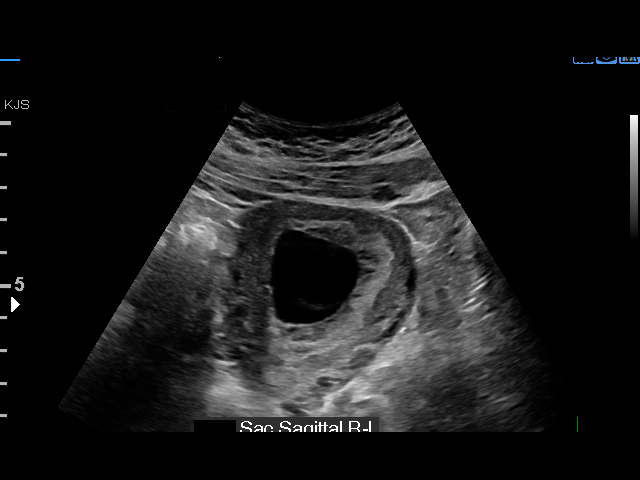
[im 40/89]
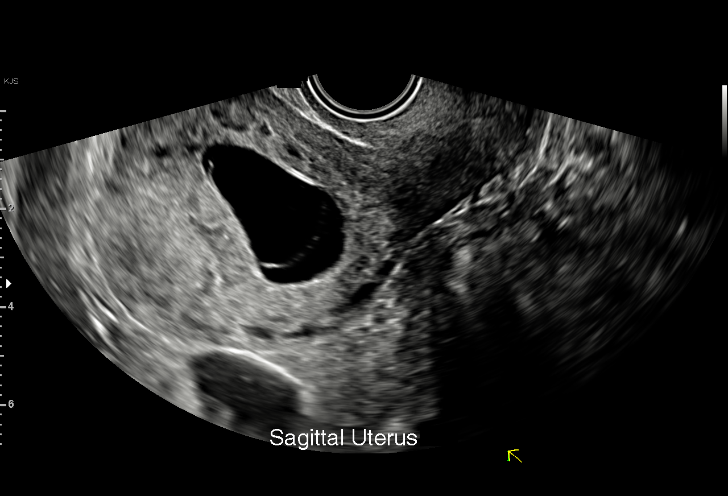
[im 46/89]
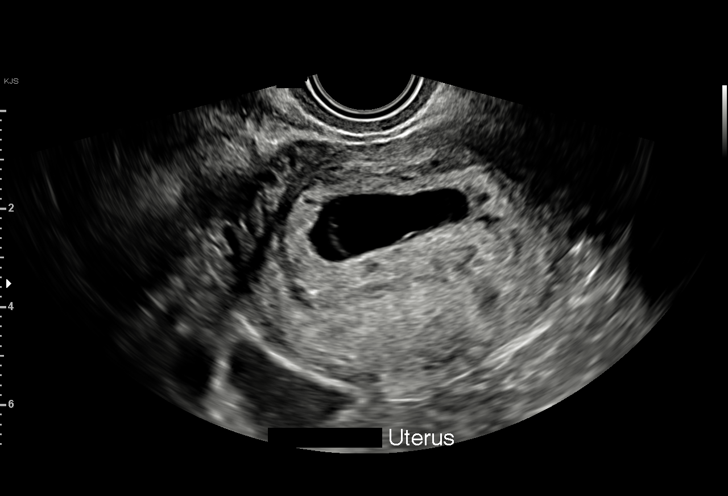
[im 49/89]
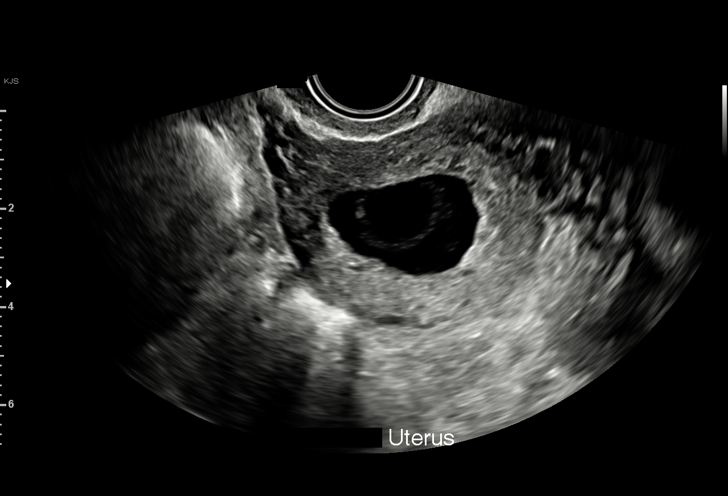
[im 56/89]
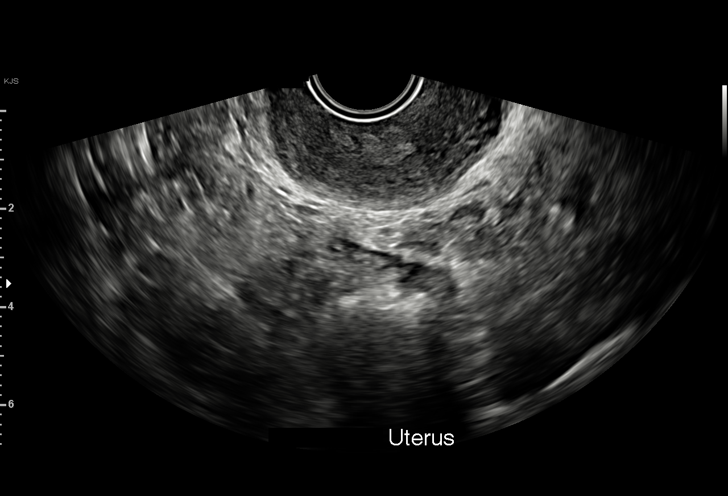
[im 62/89]
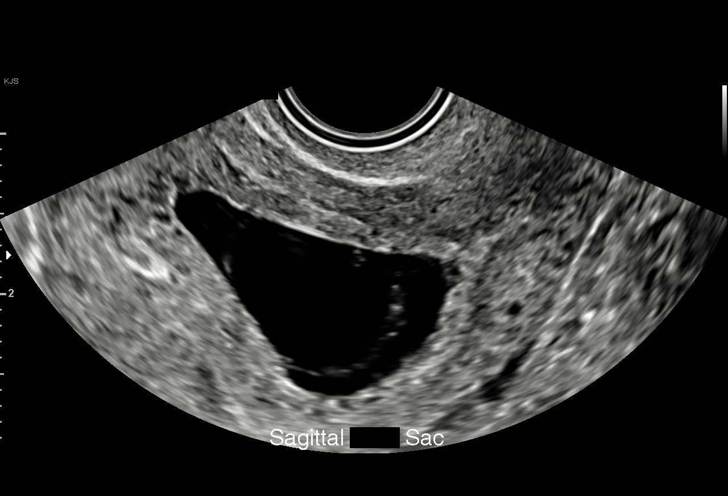
[im 69/89]
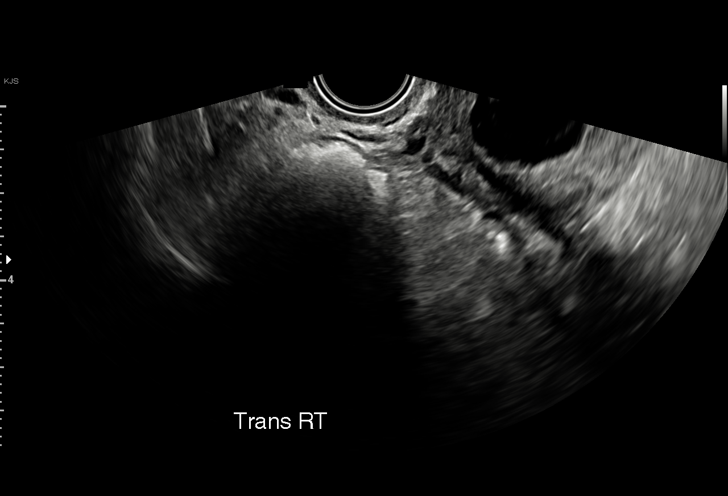
[im 75/89]
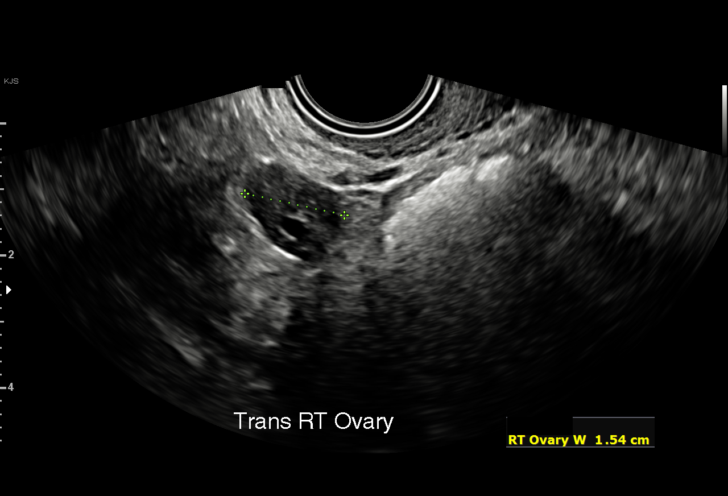
[im 82/89]
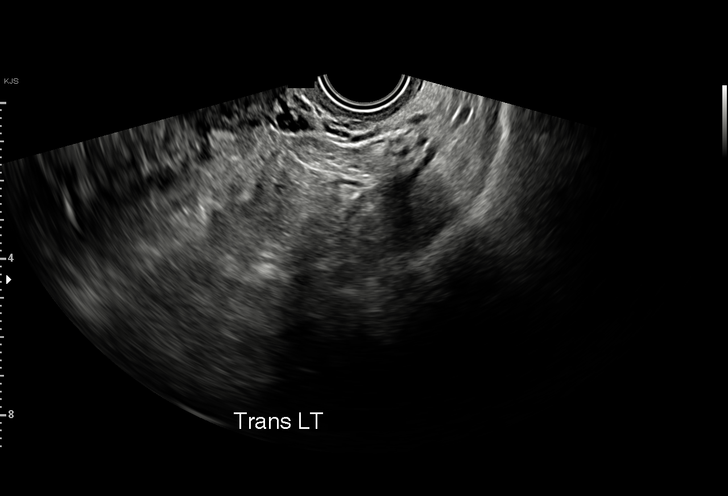
[im 89/89]
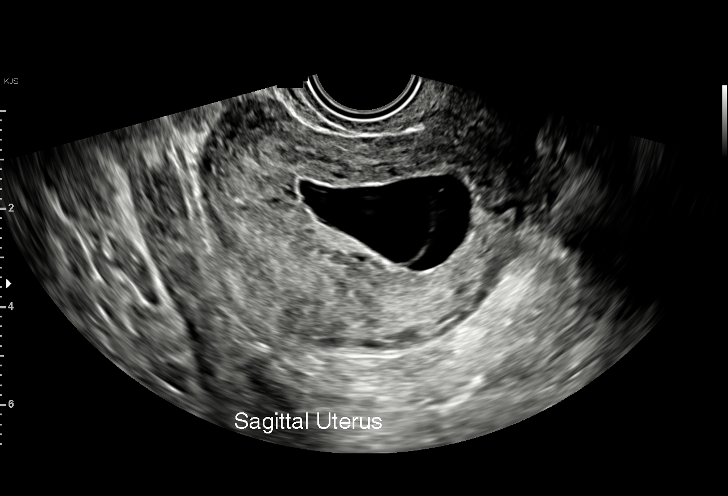

[15 of 28 positions shown; findings below may reference images not displayed]

FINDINGS: Intrauterine gestational sac: Present, single, minimally irregular;
thin membrane seen within sac

Yolk sac:  Not identified

Embryo:  Not identified

Cardiac Activity: N/A

Heart Rate: N/A  bpm

MSD: 28.8 mm   7 w   6 d

CRL:    mm    w    d                  US EDC:

Subchorionic hemorrhage:  None identified

Maternal uterus/adnexae:

RIGHT ovary normal size and morphology, 3.1 x 1.3 x 1.5 cm.

LEFT ovary normal size and morphology, 3.8 x 1.8 x 2.2 cm.

No free pelvic fluid or adnexal masses.
IMPRESSION: Gestational sac is seen within the uterus, containing a thin
membrane but no definite fetal pole or yolk sac are identified.

Based on mean sac diameter of 28.8 mm, findings meet definitive
criteria for failed pregnancy. This follows SRU consensus
guidelines: Diagnostic Criteria for Nonviable Pregnancy Early in the
First Trimester. N Engl J Med 4603;[DATE].

## 2022-11-06 ENCOUNTER — Other Ambulatory Visit: Payer: Self-pay | Admitting: Advanced Practice Midwife

## 2022-11-14 ENCOUNTER — Ambulatory Visit: Payer: Medicaid Other | Admitting: Adult Health

## 2022-12-07 ENCOUNTER — Other Ambulatory Visit: Payer: Self-pay

## 2022-12-07 ENCOUNTER — Encounter (HOSPITAL_COMMUNITY): Payer: Self-pay

## 2022-12-07 ENCOUNTER — Emergency Department (HOSPITAL_COMMUNITY): Payer: Medicaid Other

## 2022-12-07 ENCOUNTER — Emergency Department (HOSPITAL_COMMUNITY)
Admission: EM | Admit: 2022-12-07 | Discharge: 2022-12-07 | Disposition: A | Discharge status: Home / Self Care | Payer: Medicaid Other | Attending: Emergency Medicine | Admitting: Emergency Medicine

## 2022-12-07 DIAGNOSIS — D72829 Elevated white blood cell count, unspecified: Secondary | ICD-10-CM | POA: Insufficient documentation

## 2022-12-07 DIAGNOSIS — R109 Unspecified abdominal pain: Secondary | ICD-10-CM | POA: Diagnosis present

## 2022-12-07 DIAGNOSIS — N200 Calculus of kidney: Secondary | ICD-10-CM

## 2022-12-07 DIAGNOSIS — N134 Hydroureter: Secondary | ICD-10-CM | POA: Insufficient documentation

## 2022-12-07 LAB — CBC
HCT: 38 % (ref 36.0–46.0)
Hemoglobin: 12.6 g/dL (ref 12.0–15.0)
MCH: 28.4 pg (ref 26.0–34.0)
MCHC: 33.2 g/dL (ref 30.0–36.0)
MCV: 85.8 fL (ref 80.0–100.0)
Platelets: 347 10*3/uL (ref 150–400)
RBC: 4.43 MIL/uL (ref 3.87–5.11)
RDW: 12.1 % (ref 11.5–15.5)
WBC: 12.7 10*3/uL — ABNORMAL HIGH (ref 4.0–10.5)
nRBC: 0 % (ref 0.0–0.2)

## 2022-12-07 LAB — URINALYSIS, ROUTINE W REFLEX MICROSCOPIC
Bacteria, UA: NONE SEEN
Bilirubin Urine: NEGATIVE
Glucose, UA: NEGATIVE mg/dL
Ketones, ur: NEGATIVE mg/dL
Leukocytes,Ua: NEGATIVE
Nitrite: NEGATIVE
Protein, ur: 100 mg/dL — AB
RBC / HPF: >50 RBC/hpf (ref 0–5)
Specific Gravity, Urine: 1.024 (ref 1.005–1.030)
WBC, UA: >50 WBC/hpf (ref 0–5)
pH: 7 (ref 5.0–8.0)

## 2022-12-07 LAB — LIPASE, BLOOD: Lipase: 30 U/L (ref 11–51)

## 2022-12-07 LAB — COMPREHENSIVE METABOLIC PANEL
ALT: 10 U/L (ref 0–44)
AST: 13 U/L — ABNORMAL LOW (ref 15–41)
Albumin: 3.6 g/dL (ref 3.5–5.0)
Alkaline Phosphatase: 87 U/L (ref 38–126)
Anion gap: 12 (ref 5–15)
BUN: 12 mg/dL (ref 6–20)
CO2: 22 mmol/L (ref 22–32)
Calcium: 8.8 mg/dL — ABNORMAL LOW (ref 8.9–10.3)
Chloride: 105 mmol/L (ref 98–111)
Creatinine, Ser: 0.68 mg/dL (ref 0.44–1.00)
GFR, Estimated: >60 mL/min (ref >60)
Glucose, Bld: 133 mg/dL — ABNORMAL HIGH (ref 70–99)
Potassium: 3.6 mmol/L (ref 3.5–5.1)
Sodium: 139 mmol/L (ref 135–145)
Total Bilirubin: 0.4 mg/dL (ref <1.2)
Total Protein: 6.7 g/dL (ref 6.5–8.1)

## 2022-12-07 LAB — HCG, QUANTITATIVE, PREGNANCY: hCG, Beta Chain, Quant, S: <1 m[IU]/mL (ref <5)

## 2022-12-07 MED ORDER — TAMSULOSIN HCL 0.4 MG PO CAPS: 0.4000 mg | ORAL_CAPSULE | Freq: Every day | ORAL | 0 refills | Status: DC

## 2022-12-07 MED ORDER — ONDANSETRON 4 MG PO TBDP: ORAL_TABLET | ORAL | 0 refills | Status: DC

## 2022-12-07 MED ORDER — KETOROLAC TROMETHAMINE 30 MG/ML IJ SOLN
30.0000 mg | Freq: Once | INTRAMUSCULAR | Status: AC
  Administered 2022-12-07: 30 mg via INTRAVENOUS
  Filled 2022-12-07: qty 1

## 2022-12-07 MED ORDER — ONDANSETRON HCL 4 MG/2ML IJ SOLN
4.0000 mg | Freq: Once | INTRAMUSCULAR | Status: AC
  Administered 2022-12-07: 4 mg via INTRAVENOUS
  Filled 2022-12-07: qty 2

## 2022-12-07 MED ORDER — OXYCODONE-ACETAMINOPHEN 5-325 MG PO TABS: ORAL_TABLET | ORAL | 0 refills | Status: DC

## 2022-12-07 MED ORDER — HYDROMORPHONE HCL 1 MG/ML IJ SOLN
0.5000 mg | Freq: Once | INTRAMUSCULAR | Status: AC
  Administered 2022-12-07: 0.5 mg via INTRAVENOUS
  Filled 2022-12-07: qty 0.5

## 2022-12-07 NOTE — Discharge Instructions (Addendum)
Follow-up with [facility] next week.  Return sooner for any problems.  If you have problems over the weekend you should go to [facility] in [facility]

## 2022-12-07 NOTE — ED Notes (Signed)
12/07/2022 1905: Pt called b/c pharmacy could not fill her percocets d/t missing information. Spoke with EDP Dr. Deretha Emory and left secure vm for order of 5-325 percocets quantity 15 with no refills 1 tablet q 6 hrs. Call pt to update at this time.

## 2022-12-07 NOTE — ED Notes (Signed)
Pt just informed this nurse of being dx with pneumonia last week.

## 2022-12-07 NOTE — ED Notes (Signed)
POC urine preg negative 

## 2022-12-07 NOTE — ED Provider Notes (Signed)
[place] EMERGENCY DEPARTMENT AT [facility] Provider Note   CSN: [id number] Arrival date & time: 12/07/22  0732     History  Chief Complaint  Patient presents with   Abdominal Pain    Melissa Ayers is a 22 y.o. female.  Patient has a history of obesity and thyroid disease.  She is complaining of severe right flank pain  The history is provided by the patient and medical records. No language interpreter was used.  Abdominal Pain Pain location:  Generalized Pain quality: aching   Pain radiates to:  Does not radiate Pain severity:  Moderate Onset quality:  Sudden Timing:  Constant Progression:  Worsening Chronicity:  New Context: not alcohol use   Relieved by:  Nothing Worsened by:  Nothing Associated symptoms: no chest pain, no cough, no diarrhea, no fatigue and no hematuria        Home Medications Prior to Admission medications   Medication Sig Start Date End Date Taking? Authorizing Provider  acetaminophen (TYLENOL) 325 MG tablet Take 2 tablets (650 mg total) by mouth every 4 (four) hours as needed (for pain scale < 4  OR  temperature  >/=  100.5 F). Patient taking differently: Take 1,000 mg by mouth every 4 (four) hours as needed (for pain scale < 4  OR  temperature  >/=  100.5 F). 09/09/21  Yes Ozan, Jennifer, DO  ibuprofen (ADVIL) 200 MG tablet Take 800 mg by mouth every 6 (six) hours as needed for moderate pain (pain score 4-6).   Yes [provider]  levothyroxine (SYNTHROID) 75 MCG tablet Take 75 mcg by mouth daily. 08/11/20  Yes [provider]  ondansetron (ZOFRAN-ODT) 4 MG disintegrating tablet 4mg  ODT q4 hours prn nausea/vomit 12/07/22  Yes Bethann Berkshire, MD  oxyCODONE-acetaminophen (PERCOCET/ROXICET) 5-325 MG tablet Pain that is not relieved by Tylenol or Motrin alone 12/07/22  Yes Bethann Berkshire, MD  tamsulosin (FLOMAX) 0.4 MG CAPS capsule Take 1 capsule (0.4 mg total) by mouth daily. 12/07/22  Yes Bethann Berkshire, MD   valACYclovir (VALTREX) 1000 MG tablet TAKE 0.5 TABLETS BY MOUTH DAILY. Patient taking differently: Take 500 mg by mouth daily. 11/16/22  Yes Cresenzo-Dishmon, Scarlette Calico, CNM  Blood Pressure Monitor MISC For regular home bp monitoring during pregnancy 04/12/21   Myna Hidalgo, DO      Allergies    Penicillins    Review of Systems   Review of Systems  Constitutional:  Negative for appetite change and fatigue.  HENT:  Negative for congestion, ear discharge and sinus pressure.   Eyes:  Negative for discharge.  Respiratory:  Negative for cough.   Cardiovascular:  Negative for chest pain.  Gastrointestinal:  Positive for abdominal pain. Negative for diarrhea.  Genitourinary:  Negative for frequency and hematuria.  Musculoskeletal:  Positive for back pain.  Skin:  Negative for rash.  Neurological:  Negative for seizures and headaches.  Psychiatric/Behavioral:  Negative for hallucinations.     Physical Exam Updated Vital Signs Ht 5\' 6"  (1.676 m)   Wt 96.8 kg   LMP 11/30/2022 (Exact Date)   BMI 34.44 kg/m  Physical Exam Vitals and nursing note reviewed.  Constitutional:      Appearance: She is well-developed.  HENT:     Head: Normocephalic.     Nose: Nose normal.  Eyes:     General: No scleral icterus.    Conjunctiva/sclera: Conjunctivae normal.  Neck:     Thyroid: No thyromegaly.  Cardiovascular:     Rate and Rhythm:  Normal rate and regular rhythm.     Heart sounds: No murmur heard.    No friction rub. No gallop.  Pulmonary:     Breath sounds: No stridor. No wheezing or rales.  Chest:     Chest wall: No tenderness.  Abdominal:     General: There is no distension.     Tenderness: There is no abdominal tenderness. There is no rebound.  Genitourinary:    Comments: Tender right flank Musculoskeletal:        General: Normal range of motion.     Cervical back: Neck supple.  Lymphadenopathy:     Cervical: No cervical adenopathy.  Skin:    Findings: No erythema or rash.   Neurological:     Mental Status: She is alert and oriented to person, place, and time.     Motor: No abnormal muscle tone.     Coordination: Coordination normal.  Psychiatric:        Behavior: Behavior normal.     ED Results / Procedures / Treatments   Labs (all labs ordered are listed, but only abnormal results are displayed) Labs Reviewed  COMPREHENSIVE METABOLIC PANEL - Abnormal; Notable for the following components:      Result Value   Glucose, Bld 133 (*)    Calcium 8.8 (*)    AST 13 (*)    All other components within normal limits  CBC - Abnormal; Notable for the following components:   WBC 12.7 (*)    All other components within normal limits  URINALYSIS, ROUTINE W REFLEX MICROSCOPIC - Abnormal; Notable for the following components:   Color, Urine AMBER (*)    APPearance CLOUDY (*)    Hgb urine dipstick LARGE (*)    Protein, ur 100 (*)    All other components within normal limits  URINE CULTURE  LIPASE, BLOOD  HCG, QUANTITATIVE, PREGNANCY  POC URINE PREG, ED    EKG None  Radiology CT Renal Stone Study  Result Date: 12/07/2022 CLINICAL DATA:  Right flank pain with nausea and vomiting EXAM: CT ABDOMEN AND PELVIS WITHOUT CONTRAST TECHNIQUE: Multidetector CT imaging of the abdomen and pelvis was performed following the standard protocol without IV contrast. RADIATION DOSE REDUCTION: This exam was performed according to the departmental dose-optimization program which includes automated exposure control, adjustment of the mA and/or kV according to patient size and/or use of iterative reconstruction technique. COMPARISON:  10/31/2005 FINDINGS: Lower chest: Included lung bases are clear.  Heart size is normal. Hepatobiliary: No focal liver abnormality is seen. No gallstones, gallbladder wall thickening, or biliary dilatation. Pancreas: Unremarkable. No pancreatic ductal dilatation or surrounding inflammatory changes. Spleen: Normal in size without focal abnormality.  Adrenals/Urinary Tract: Unremarkable adrenal glands. Numerous punctate 1-2 mm stones within both kidneys. 4 mm obstructing stone at the right ureterovesical junction resulting in moderate right hydroureteronephrosis. No left-sided ureteral calculus or left-sided hydronephrosis. Urinary bladder within normal limits. Stomach/Bowel: Stomach is within normal limits. Appendix appears normal. No evidence of bowel wall thickening, distention, or inflammatory changes. Vascular/Lymphatic: No significant vascular findings are present. No enlarged abdominal or pelvic lymph nodes. Reproductive: Uterus and bilateral adnexa are unremarkable. Other: No free fluid. No abdominopelvic fluid collection. No pneumoperitoneum. No abdominal wall hernia. Musculoskeletal: No acute or significant osseous findings. IMPRESSION: Bilateral nephrolithiasis with an obstructing 4 mm stone at the right UVJ resulting in moderate right hydroureteronephrosis. Electronically Signed   By: Duanne Guess D.O.   On: 12/07/2022 12:44    Procedures Procedures    Medications  Ordered in ED Medications  HYDROmorphone (DILAUDID) injection 0.5 mg (0.5 mg Intravenous Given 12/07/22 0833)  ondansetron (ZOFRAN) injection 4 mg (4 mg Intravenous Given 12/07/22 0838)  ketorolac (TORADOL) 30 MG/ML injection 30 mg (30 mg Intravenous Given 12/07/22 1040)  ondansetron (ZOFRAN) injection 4 mg (4 mg Intravenous Given 12/07/22 1040)    ED Course/ Medical Decision Making/ A&P                                 Medical Decision Making Amount and/or Complexity of Data Reviewed Labs: ordered. Radiology: ordered.  Risk Prescription drug management.  This patient presents to the ED for concern of right flank and abdominal pain, this involves an extensive number of treatment options, and is a complaint that carries with it a high risk of complications and morbidity.  The differential diagnosis includes kidney stone, UTI   Co morbidities that complicate  the patient evaluation  Thyroid disease   Additional history obtained:  Additional history obtained from patient External records from outside source obtained and reviewed including hospital records   Lab Tests:  I Ordered, and personally interpreted labs.  The pertinent results include: White count 12.7, creatinine 0.68   Imaging Studies ordered:  I ordered imaging studies including CT abdomen I independently visualized and interpreted imaging which showed 4 mm right UVJ stone I agree with the radiologist interpretation   Cardiac Monitoring: / EKG:  The patient was maintained on a cardiac monitor.  I personally viewed and interpreted the cardiac monitored which showed an underlying rhythm of: Normal sinus rhythm   Consultations Obtained: No consultant Problem List / ED Course / Critical interventions / Medication management  Flank pain I ordered medication including Dilaudid for pain Reevaluation of the patient after these medicines showed that the patient improved I have reviewed the patients home medicines and have made adjustments as needed   Social Determinants of Health:  None   Test / Admission - Considered:  None  Patient has a 4 mm stone at the right UVJ.  Patient sent home on Flomax, Percocet, Zofran.  She is taking Levaquin for a respiratory infection.  She will follow-up with urology        Final Clinical Impression(s) / ED Diagnoses Final diagnoses:  Kidney stone    Rx / DC Orders ED Discharge Orders          Ordered    tamsulosin (FLOMAX) 0.4 MG CAPS capsule  Daily        12/07/22 1331    ondansetron (ZOFRAN-ODT) 4 MG disintegrating tablet        12/07/22 1331    oxyCODONE-acetaminophen (PERCOCET/ROXICET) 5-325 MG tablet        12/07/22 1331              Bethann Berkshire, MD 12/08/22 1736

## 2022-12-07 NOTE — ED Triage Notes (Signed)
Pt c/o rt side pain/ lower abdominal pain starting around 0600 this morning. Pt states feeling like she has to have a BM but is unable to. Pt states multiple times of vomiting this morning since.

## 2022-12-09 LAB — URINE CULTURE: Culture: <10000 — AB

## 2022-12-20 ENCOUNTER — Other Ambulatory Visit: Payer: Self-pay

## 2022-12-20 ENCOUNTER — Encounter (HOSPITAL_COMMUNITY): Payer: Self-pay

## 2022-12-20 ENCOUNTER — Emergency Department (HOSPITAL_COMMUNITY): Payer: Medicaid Other

## 2022-12-20 ENCOUNTER — Emergency Department (HOSPITAL_COMMUNITY)
Admission: EM | Admit: 2022-12-20 | Discharge: 2022-12-21 | Disposition: A | Discharge status: Home / Self Care | Payer: Medicaid Other | Attending: Emergency Medicine | Admitting: Emergency Medicine

## 2022-12-20 DIAGNOSIS — J189 Pneumonia, unspecified organism: Secondary | ICD-10-CM | POA: Insufficient documentation

## 2022-12-20 DIAGNOSIS — R059 Cough, unspecified: Secondary | ICD-10-CM | POA: Diagnosis present

## 2022-12-20 LAB — CBC WITH DIFFERENTIAL/PLATELET
Abs Immature Granulocytes: 0.05 10*3/uL (ref 0.00–0.07)
Basophils Absolute: 0 10*3/uL (ref 0.0–0.1)
Basophils Relative: 0 %
Eosinophils Absolute: 0 10*3/uL (ref 0.0–0.5)
Eosinophils Relative: 0 %
HCT: 41.8 % (ref 36.0–46.0)
Hemoglobin: 13.9 g/dL (ref 12.0–15.0)
Immature Granulocytes: 0 %
Lymphocytes Relative: 9 %
Lymphs Abs: 1.2 10*3/uL (ref 0.7–4.0)
MCH: 29.3 pg (ref 26.0–34.0)
MCHC: 33.3 g/dL (ref 30.0–36.0)
MCV: 88 fL (ref 80.0–100.0)
Monocytes Absolute: 0.8 10*3/uL (ref 0.1–1.0)
Monocytes Relative: 7 %
Neutro Abs: 10.3 10*3/uL — ABNORMAL HIGH (ref 1.7–7.7)
Neutrophils Relative %: 84 %
Platelets: 275 10*3/uL (ref 150–400)
RBC: 4.75 MIL/uL (ref 3.87–5.11)
RDW: 12.7 % (ref 11.5–15.5)
WBC: 12.4 10*3/uL — ABNORMAL HIGH (ref 4.0–10.5)
nRBC: 0 % (ref 0.0–0.2)

## 2022-12-20 LAB — COMPREHENSIVE METABOLIC PANEL
ALT: 15 U/L (ref 0–44)
AST: 15 U/L (ref 15–41)
Albumin: 4.1 g/dL (ref 3.5–5.0)
Alkaline Phosphatase: 86 U/L (ref 38–126)
Anion gap: 11 (ref 5–15)
BUN: 12 mg/dL (ref 6–20)
CO2: 23 mmol/L (ref 22–32)
Calcium: 9.8 mg/dL (ref 8.9–10.3)
Chloride: 102 mmol/L (ref 98–111)
Creatinine, Ser: 0.81 mg/dL (ref 0.44–1.00)
GFR, Estimated: >60 mL/min (ref >60)
Glucose, Bld: 105 mg/dL — ABNORMAL HIGH (ref 70–99)
Potassium: 3.9 mmol/L (ref 3.5–5.1)
Sodium: 136 mmol/L (ref 135–145)
Total Bilirubin: 1.8 mg/dL — ABNORMAL HIGH (ref <1.2)
Total Protein: 7.9 g/dL (ref 6.5–8.1)

## 2022-12-20 LAB — POC URINE PREG, ED: Preg Test, Ur: NEGATIVE

## 2022-12-20 NOTE — ED Triage Notes (Signed)
Pt reports she had pna 2.5 weeks ago and felt like she got better but then started to run a fever and have a cough again today.  Pt reports she went back to [facility] and they told her that her xray looked slightly worse and that she needed to come to the ER because they were not able to tell her what kind of pneumonia she has.

## 2022-12-20 NOTE — ED Provider Notes (Signed)
[place] EMERGENCY DEPARTMENT AT [facility] Provider Note   CSN: [id number] Arrival date & time: 12/20/22  2037     History {Add pertinent medical, surgical, social history, OB history to HPI:1} Chief Complaint  Patient presents with   Cough    Melissa Ayers is a 22 y.o. female.  Patient referred from urgent care.  She was seen at the urgent care 2 and half weeks ago and diagnosed with pneumonia.  She completed a 7-day course of Levaquin.  Patient reports that she started to feel better but then recently started having increased cough productive of sputum with fever.  She went back to the urgent care today, x-ray looked worse so she was referred to the ER.       Home Medications Prior to Admission medications   Medication Sig Start Date End Date Taking? Authorizing Provider  acetaminophen (TYLENOL) 325 MG tablet Take 2 tablets (650 mg total) by mouth every 4 (four) hours as needed (for pain scale < 4  OR  temperature  >/=  100.5 F). Patient taking differently: Take 1,000 mg by mouth every 4 (four) hours as needed (for pain scale < 4  OR  temperature  >/=  100.5 F). 09/09/21   Myna Hidalgo, DO  Blood Pressure Monitor MISC For regular home bp monitoring during pregnancy 04/12/21   Myna Hidalgo, DO  ibuprofen (ADVIL) 200 MG tablet Take 800 mg by mouth every 6 (six) hours as needed for moderate pain (pain score 4-6).    [provider]  levothyroxine (SYNTHROID) 75 MCG tablet Take 75 mcg by mouth daily. 08/11/20   [provider]  ondansetron (ZOFRAN-ODT) 4 MG disintegrating tablet 4mg  ODT q4 hours prn nausea/vomit 12/07/22   Bethann Berkshire, MD  oxyCODONE-acetaminophen (PERCOCET/ROXICET) 5-325 MG tablet Pain that is not relieved by Tylenol or Motrin alone 12/07/22   Bethann Berkshire, MD  tamsulosin (FLOMAX) 0.4 MG CAPS capsule Take 1 capsule (0.4 mg total) by mouth daily. 12/07/22   Bethann Berkshire, MD  valACYclovir (VALTREX) 1000 MG tablet TAKE 0.5 TABLETS  BY MOUTH DAILY. Patient taking differently: Take 500 mg by mouth daily. 11/16/22   Cresenzo-Dishmon, Scarlette Calico, CNM      Allergies    Penicillins    Review of Systems   Review of Systems  Physical Exam Updated Vital Signs BP 134/85 (BP Location: Right Arm)   Pulse (!) 122   Temp 99.2 F (37.3 C) (Oral)   Resp 16   Ht 5\' 6"  (1.676 m)   Wt 96.8 kg   LMP 11/30/2022 (Exact Date)   SpO2 97%   BMI 34.44 kg/m  Physical Exam Vitals and nursing note reviewed.  Constitutional:      General: She is not in acute distress.    Appearance: She is well-developed.  HENT:     Head: Normocephalic and atraumatic.     Mouth/Throat:     Mouth: Mucous membranes are moist.  Eyes:     General: Vision grossly intact. Gaze aligned appropriately.     Extraocular Movements: Extraocular movements intact.     Conjunctiva/sclera: Conjunctivae normal.  Cardiovascular:     Rate and Rhythm: Regular rhythm. Tachycardia present.     Pulses: Normal pulses.     Heart sounds: Normal heart sounds, S1 normal and S2 normal. No murmur heard.    No friction rub. No gallop.  Pulmonary:     Effort: Pulmonary effort is normal. No respiratory distress.     Breath sounds: Normal breath  sounds.  Abdominal:     General: Bowel sounds are normal.     Palpations: Abdomen is soft.     Tenderness: There is no abdominal tenderness. There is no guarding or rebound.     Hernia: No hernia is present.  Musculoskeletal:        General: No swelling.     Cervical back: Full passive range of motion without pain, normal range of motion and neck supple. No spinous process tenderness or muscular tenderness. Normal range of motion.     Right lower leg: No edema.     Left lower leg: No edema.  Skin:    General: Skin is warm and dry.     Capillary Refill: Capillary refill takes less than 2 seconds.     Findings: No ecchymosis, erythema, rash or wound.  Neurological:     General: No focal deficit present.     Mental Status: She is  alert and oriented to person, place, and time.     GCS: GCS eye subscore is 4. GCS verbal subscore is 5. GCS motor subscore is 6.     Cranial Nerves: Cranial nerves 2-12 are intact.     Sensory: Sensation is intact.     Motor: Motor function is intact.     Coordination: Coordination is intact.  Psychiatric:        Attention and Perception: Attention normal.        Mood and Affect: Mood normal.        Speech: Speech normal.        Behavior: Behavior normal.     ED Results / Procedures / Treatments   Labs (all labs ordered are listed, but only abnormal results are displayed) Labs Reviewed  CBC WITH DIFFERENTIAL/PLATELET - Abnormal; Notable for the following components:      Result Value   WBC 12.4 (*)    Neutro Abs 10.3 (*)    All other components within normal limits  COMPREHENSIVE METABOLIC PANEL - Abnormal; Notable for the following components:   Glucose, Bld 105 (*)    Total Bilirubin 1.8 (*)    All other components within normal limits    EKG None  Radiology DG Chest 2 View  Result Date: 12/20/2022 CLINICAL DATA:  Cough EXAM: CHEST - 2 VIEW COMPARISON:  12/20/2022 FINDINGS: Right upper lobe consolidation persists. Normal cardiac size. No pneumothorax IMPRESSION: Persistent right upper lobe consolidation, possible pneumonia. Electronically Signed   By: Jasmine Pang M.D.   On: 12/20/2022 21:53    Procedures Procedures  {Document cardiac monitor, telemetry assessment procedure when appropriate:1}  Medications Ordered in ED Medications - No data to display  ED Course/ Medical Decision Making/ A&P   {   Click here for ABCD2, HEART and other calculatorsREFRESH Note before signing :1}                              Medical Decision Making Amount and/or Complexity of Data Reviewed Labs: ordered. Radiology: ordered.   ***  {Document critical care time when appropriate:1} {Document review of labs and clinical decision tools ie heart score, Chads2Vasc2 etc:1}   {Document your independent review of radiology images, and any outside records:1} {Document your discussion with family members, caretakers, and with consultants:1} {Document social determinants of health affecting pt's care:1} {Document your decision making why or why not admission, treatments were needed:1} Final Clinical Impression(s) / ED Diagnoses Final diagnoses:  None    Rx /  DC Orders ED Discharge Orders     None

## 2022-12-21 ENCOUNTER — Emergency Department (HOSPITAL_COMMUNITY): Payer: Medicaid Other

## 2022-12-21 ENCOUNTER — Encounter (HOSPITAL_COMMUNITY): Payer: Self-pay | Admitting: Radiology

## 2022-12-21 LAB — URINALYSIS, ROUTINE W REFLEX MICROSCOPIC
Bilirubin Urine: NEGATIVE
Glucose, UA: NEGATIVE mg/dL
Hgb urine dipstick: NEGATIVE
Ketones, ur: 5 mg/dL — AB
Leukocytes,Ua: NEGATIVE
Nitrite: NEGATIVE
Protein, ur: 30 mg/dL — AB
Specific Gravity, Urine: 1.025 (ref 1.005–1.030)
pH: 7 (ref 5.0–8.0)

## 2022-12-21 MED ORDER — SODIUM CHLORIDE 0.9 % IV SOLN
2.0000 g | Freq: Once | INTRAVENOUS | Status: AC
  Administered 2022-12-21: 2 g via INTRAVENOUS
  Filled 2022-12-21: qty 20

## 2022-12-21 MED ORDER — AZITHROMYCIN 250 MG PO TABS: 500.0000 mg | ORAL_TABLET | Freq: Once | ORAL | Status: DC

## 2022-12-21 MED ORDER — DOXYCYCLINE HYCLATE 100 MG PO CAPS: 100.0000 mg | ORAL_CAPSULE | Freq: Two times a day (BID) | ORAL | 0 refills | Status: DC

## 2022-12-21 MED ORDER — DOXYCYCLINE HYCLATE 100 MG PO TABS
100.0000 mg | ORAL_TABLET | Freq: Once | ORAL | Status: AC
  Administered 2022-12-21: 100 mg via ORAL
  Filled 2022-12-21: qty 1

## 2022-12-21 MED ORDER — ACETAMINOPHEN 500 MG PO TABS
1000.0000 mg | ORAL_TABLET | Freq: Once | ORAL | Status: AC
  Administered 2022-12-21: 1000 mg via ORAL
  Filled 2022-12-21: qty 2

## 2022-12-21 MED ORDER — IOHEXOL 350 MG/ML SOLN
75.0000 mL | Freq: Once | INTRAVENOUS | Status: AC | PRN
  Administered 2022-12-21: 75 mL via INTRAVENOUS

## 2022-12-21 MED ORDER — CEFDINIR 300 MG PO CAPS: 300.0000 mg | ORAL_CAPSULE | Freq: Two times a day (BID) | ORAL | 0 refills | Status: DC

## 2022-12-21 NOTE — Discharge Instructions (Signed)
Take the complete course of both antibiotics.  Follow-up with Dr. Phillips Odor in 2 weeks for repeat x-ray.  If any of your symptoms worsen, return to the emergency department.

## 2022-12-26 NOTE — Progress Notes (Unsigned)
Name: Melissa Ayers DOB: 2000/03/21 MRN: [id number]  History of Present Illness: Melissa Ayers is a 22 y.o. female who presents today as a new patient at [facility]. All available relevant medical records have been reviewed.   She reports concern of kidney stone(s).  She {Actions; denies-reports:120008} prior history of kidney stones. She {Actions; denies-reports:120008} prior history of kidney stone procedure(s) ***including ***ESWL ***ureteroscopic stone manipulation ***PCNL.  Recent history: > 12/07/2022: - Seen in the ER for right flank pain. CMP with normal renal function (GFR >60; creatinine 0.68). CBC with mild leukocytosis (WBC 12.7). UA showed >50 WBC/hpf and >50 RBC/hpf; no bacteria. Urine culture negative. CT showed: Bilateral nephrolithiasis with an obstructing 4 mm stone at the right UVJ resulting in moderate right hydroureteronephrosis.  She was discharged with prescriptions for Flomax, Zofran, and Percocet 5-325 mg (#*** last dispensed *** per PMP aware controlled substance registry).  > 12/20/2022: - UA showed no evidence of UTI. - Normal renal function (creatinine 0.81, GFR >60).  > Of note, she was prescribed Levaquin x7 days on 12/01/2022 for community-acquired pneumonia. Seen in ER for persistent respiratory symptoms on 12/20/2022; Cefdinir and Doxycycline were prescribed.  Today: She {Actions; denies-reports:120008} passing the stone. She {Actions; denies-reports:120008} flank pain or abdominal pain. She {Actions; denies-reports:120008} fevers, nausea, or vomiting.  She {Actions; denies-reports:120008} increased urinary urgency, frequency, nocturia, dysuria, gross hematuria, hesitancy, straining to void, or sensations of incomplete emptying.   Fall Screening: Do you usually have a device to assist in your mobility? {yes/no:20286} ***cane / ***walker / ***wheelchair  Medications: Current Outpatient Medications  Medication Sig Dispense  Refill   acetaminophen (TYLENOL) 325 MG tablet Take 2 tablets (650 mg total) by mouth every 4 (four) hours as needed (for pain scale < 4  OR  temperature  >/=  100.5 F). (Patient taking differently: Take 1,000 mg by mouth every 4 (four) hours as needed (for pain scale < 4  OR  temperature  >/=  100.5 F).)     Blood Pressure Monitor MISC For regular home bp monitoring during pregnancy 1 each 0   cefdinir (OMNICEF) 300 MG capsule Take 1 capsule (300 mg total) by mouth 2 (two) times daily. 20 capsule 0   doxycycline (VIBRAMYCIN) 100 MG capsule Take 1 capsule (100 mg total) by mouth 2 (two) times daily. 20 capsule 0   ibuprofen (ADVIL) 200 MG tablet Take 800 mg by mouth every 6 (six) hours as needed for moderate pain (pain score 4-6).     levothyroxine (SYNTHROID) 75 MCG tablet Take 75 mcg by mouth daily.     ondansetron (ZOFRAN-ODT) 4 MG disintegrating tablet 4mg  ODT q4 hours prn nausea/vomit 10 tablet 0   oxyCODONE-acetaminophen (PERCOCET/ROXICET) 5-325 MG tablet Pain that is not relieved by Tylenol or Motrin alone 15 tablet 0   tamsulosin (FLOMAX) 0.4 MG CAPS capsule Take 1 capsule (0.4 mg total) by mouth daily. 10 capsule 0   valACYclovir (VALTREX) 1000 MG tablet TAKE 0.5 TABLETS BY MOUTH DAILY. (Patient taking differently: Take 500 mg by mouth daily.) 90 tablet 3   No current facility-administered medications for this visit.    Allergies: Allergies  Allergen Reactions   Penicillins Rash    Past Medical History:  Diagnosis Date   Depression    GERD (gastroesophageal reflux disease)    HSV-2 infection    Hypothyroidism    Past Surgical History:  Procedure Laterality Date   CESAREAN SECTION N/A 09/07/2021   Procedure: CESAREAN SECTION;  Surgeon: Donavan Foil,  Regis Bill, MD;  Location: [facility];  Service: Obstetrics;  Laterality: N/A;   TONSILLECTOMY Bilateral 11/24/2019   Procedure: TONSILLECTOMY;  Surgeon: Newman Pies, MD;  Location: [facility];  Service: ENT;  Laterality:  Bilateral;   WISDOM TOOTH EXTRACTION     Family History  Problem Relation Age of Onset   Lung cancer Maternal Grandmother    Social History   Socioeconomic History   Marital status: Media planner    Spouse name: Not on file   Number of children: Not on file   Years of education: Not on file   Highest education level: Not on file  Occupational History   Not on file  Tobacco Use   Smoking status: Former    Current packs/day: 0.25    Types: Cigarettes   Smokeless tobacco: Never  Vaping Use   Vaping status: Every Day  Substance and Sexual Activity   Alcohol use: Not Currently    Comment: hardly ever   Drug use: Not Currently    Types: Marijuana   Sexual activity: Yes    Birth control/protection: Injection  Other Topics Concern   Not on file  Social History Narrative   Not on file   Social Determinants of Health   Financial Resource Strain: Low Risk  (12/06/2021)   Overall Financial Resource Strain (CARDIA)    Difficulty of Paying Living Expenses: Not very hard  Food Insecurity: No Food Insecurity (12/06/2021)   Hunger Vital Sign    Worried About Running Out of Food in the Last Year: Never true    Ran Out of Food in the Last Year: Never true  Transportation Needs: No Transportation Needs (12/06/2021)   PRAPARE - Administrator, Civil Service (Medical): No    Lack of Transportation (Non-Medical): No  Physical Activity: Insufficiently Active (12/06/2021)   Exercise Vital Sign    Days of Exercise per Week: 4 days    Minutes of Exercise per Session: 30 min  Stress: No Stress Concern Present (12/06/2021)   [employer] of Occupational Health - Occupational Stress Questionnaire    Feeling of Stress : Not at all  Social Connections: Moderately Integrated (12/06/2021)   Social Connection and Isolation Panel [NHANES]    Frequency of Communication with Friends and Family: More than three times a week    Frequency of Social Gatherings with Friends and  Family: Twice a week    Attends Religious Services: 1 to 4 times per year    Active Member of [employer] or Organizations: No    Attends Banker Meetings: Never    Marital Status: Living with partner  Intimate Partner Violence: Not At Risk (12/06/2021)   Humiliation, Afraid, Rape, and Kick questionnaire    Fear of Current or Ex-Partner: No    Emotionally Abused: No    Physically Abused: No    Sexually Abused: No    SUBJECTIVE  Review of Systems*** Constitutional: Patient denies any unintentional weight loss or change in strength lntegumentary: Patient denies any rashes or pruritus Cardiovascular: Patient denies chest pain or syncope Respiratory: Patient denies shortness of breath Gastrointestinal: Patient ***denies nausea, vomiting, constipation, or diarrhea Musculoskeletal: Patient denies muscle cramps or weakness Neurologic: Patient denies convulsions or seizures Allergic/Immunologic: Patient denies recent allergic reaction(s) Hematologic/Lymphatic: Patient denies bleeding tendencies Endocrine: Patient denies heat/cold intolerance  GU: As per HPI.  OBJECTIVE There were no vitals filed for this visit. There is no height or weight on file to calculate BMI.  Physical  Examination*** Constitutional: No obvious distress; patient is non-toxic appearing  Cardiovascular: No visible lower extremity edema.  Respiratory: The patient does not have audible wheezing/stridor; respirations do not appear labored  Gastrointestinal: Abdomen non-distended Musculoskeletal: Normal ROM of UEs  Skin: No obvious rashes/open sores  Neurologic: CN 2-12 grossly intact Psychiatric: Answered questions appropriately with normal affect  Hematologic/Lymphatic/Immunologic: No obvious bruises or sites of spontaneous bleeding  UA: ***negative *** WBC/hpf, *** RBC/hpf, *** bacteria ***with no evidence of UTI ***with no evidence of microscopic hematuria PVR: *** ml  ASSESSMENT No diagnosis  found.  ***We reviewed recent imaging results; ***awaiting radiology results, appears to have ***no acute findings per provider interpretation.  ***Advised adequate hydration and we discussed option to consider low oxalate diet given that calcium oxalate is the most common type of stone. Handout provided about stone prevention diet.  For acute GU stone symptoms we agreed to proceed with: - ***RUS and KUB / ***CT stone study to evaluate current stone burden.  - ***CMP to assess kidney function. - ***Flomax 0.4 mg daily for medical expulsive therapy (MET), which may improve passage of stone(s).  - For pain management, we discussed the use of opioids versus OTC analgesics. A 5 day prescription was sent for ***Percocet for PRN use for severe pain.  - For nausea / vomiting, a prescription was sent for ***Zofran / ***Phenergan for PRN use.  ***We discussed the various treatment options including ***medical expulsive therapy (MET), ***extracorporeal shock wave lithotripsy (ESWL), ureteroscopic stone manipulation (URS), or ***percutaneous nephrolithotomy (PCNL). We discussed possible risks and benefits of intervention including but not limited to: including pain, infection, sepsis, UTI, ureter perforation, need for stenting, post-op ureteral stricture, hematuria.  ***Will consult with ***Dr. Ronne Binning and notify patient of his recommendations ***for next steps / ***following review of imaging results.  Will plan to follow up in ***2 weeks ***6 months with ***KUB ***RUS for stone surveillance or sooner if needed.   She was advised to contact urology provider or go to the ER if She develops fever >101F, uncontrollable pain, or other significantly concerning symptoms prior to next office visit.  She verbalized understanding and agreement. All questions were answered.   PLAN Advised the following: ***Flomax daily x2 weeks. ***Analgesics PRN for pain. ***Zofran PRN for nausea. ***No follow-ups on  file.  No orders of the defined types were placed in this encounter.   It has been explained that the patient is to follow regularly with their PCP in addition to all other providers involved in their care and to follow instructions provided by these respective offices. Patient advised to contact urology clinic if any urologic-pertaining questions, concerns, new symptoms or problems arise in the interim period.  There are no Patient Instructions on file for this visit.  Electronically signed by: Donnita Falls, MSN, FNP-C, CUNP 12/26/2022 2:21 PM

## 2022-12-27 ENCOUNTER — Ambulatory Visit: Payer: Medicaid Other | Admitting: Urology

## 2022-12-27 ENCOUNTER — Ambulatory Visit (HOSPITAL_COMMUNITY)
Admission: RE | Admit: 2022-12-27 | Discharge: 2022-12-27 | Disposition: A | Discharge status: Home / Self Care | Payer: Medicaid Other | Source: Ambulatory Visit | Attending: Urology | Admitting: Urology

## 2022-12-27 ENCOUNTER — Encounter: Payer: Self-pay | Admitting: Urology

## 2022-12-27 VITALS — BP 128/80 | HR 93 | Temp 97.9°F

## 2022-12-27 DIAGNOSIS — N200 Calculus of kidney: Secondary | ICD-10-CM | POA: Insufficient documentation

## 2022-12-27 DIAGNOSIS — Z09 Encounter for follow-up examination after completed treatment for conditions other than malignant neoplasm: Secondary | ICD-10-CM | POA: Diagnosis not present

## 2022-12-27 DIAGNOSIS — N201 Calculus of ureter: Secondary | ICD-10-CM

## 2022-12-27 DIAGNOSIS — Z87442 Personal history of urinary calculi: Secondary | ICD-10-CM

## 2022-12-27 LAB — URINALYSIS, ROUTINE W REFLEX MICROSCOPIC
Bilirubin, UA: NEGATIVE
Glucose, UA: NEGATIVE
Ketones, UA: NEGATIVE
Leukocytes,UA: NEGATIVE
Nitrite, UA: NEGATIVE
Protein,UA: NEGATIVE
RBC, UA: NEGATIVE
Specific Gravity, UA: 1.03 (ref 1.005–1.030)
Urobilinogen, Ur: 0.2 mg/dL (ref 0.2–1.0)
pH, UA: 6 (ref 5.0–7.5)

## 2023-01-04 ENCOUNTER — Telehealth: Payer: Self-pay

## 2023-01-04 NOTE — Telephone Encounter (Signed)
Pt called to as about her xray results. She was informed that the results weren't back yet, but when we get them in someone will be in contact with her.

## 2023-01-05 ENCOUNTER — Other Ambulatory Visit (HOSPITAL_COMMUNITY): Payer: Self-pay | Admitting: Family Medicine

## 2023-01-05 ENCOUNTER — Ambulatory Visit (HOSPITAL_COMMUNITY)
Admission: RE | Admit: 2023-01-05 | Discharge: 2023-01-05 | Disposition: A | Discharge status: Home / Self Care | Payer: Medicaid Other | Source: Ambulatory Visit | Attending: Family Medicine | Admitting: Family Medicine

## 2023-01-05 DIAGNOSIS — J157 Pneumonia due to Mycoplasma pneumoniae: Secondary | ICD-10-CM | POA: Insufficient documentation

## 2023-01-09 ENCOUNTER — Telehealth: Payer: Self-pay

## 2023-01-09 NOTE — Telephone Encounter (Signed)
-----   Message from Donnita Falls sent at 01/08/2023  1:34 PM EST ----- Please let pt know: Previous 4 mm right UVJ calculus as not clearly demonstrated on KUB done 12/27/2022. Appears to have passed. F/u in 6 months as planned.

## 2023-01-09 NOTE — Telephone Encounter (Signed)
Left detailed message making patient aware of Sarah's response.

## 2023-06-26 NOTE — Progress Notes (Deleted)
 Name: Melissa Ayers DOB: 2000/07/25 MRN: [id number]  History of Present Illness: Melissa Ayers is a 23 y.o. female who presents today for follow up visit at [facility]. Relevant History includes: 1. Kidney stone x1 in November 2024.  At last visit on 12/27/2022: Doing well. Previous 4 mm right UVJ calculus as not clearly demonstrated on KUB; appears to have passed.   Today: KUB today: Awaiting radiology read; *** appreciated per provider interpretation.  She {Actions; denies-reports:120008} recent stone passage. She {Actions; denies-reports:120008} flank pain or abdominal pain. She {Actions; denies-reports:120008} fevers, nausea, or vomiting.  She {Actions; denies-reports:120008} increased urinary urgency, frequency, nocturia, dysuria, gross hematuria, hesitancy, straining to void, or sensations of incomplete emptying.  Medications: Current Outpatient Medications  Medication Sig Dispense Refill   acetaminophen  (TYLENOL ) 325 MG tablet Take 2 tablets (650 mg total) by mouth every 4 (four) hours as needed (for pain scale < 4  OR  temperature  >/=  100.5 F). (Patient taking differently: Take 1,000 mg by mouth every 4 (four) hours as needed (for pain scale < 4  OR  temperature  >/=  100.5 F).)     Blood Pressure Monitor MISC For regular home bp monitoring during pregnancy 1 each 0   cefdinir  (OMNICEF ) 300 MG capsule Take 1 capsule (300 mg total) by mouth 2 (two) times daily. 20 capsule 0   doxycycline  (VIBRAMYCIN ) 100 MG capsule Take 1 capsule (100 mg total) by mouth 2 (two) times daily. 20 capsule 0   ibuprofen  (ADVIL ) 200 MG tablet Take 800 mg by mouth every 6 (six) hours as needed for moderate pain (pain score 4-6).     levothyroxine  (SYNTHROID ) 75 MCG tablet Take 75 mcg by mouth daily.     ondansetron  (ZOFRAN -ODT) 4 MG disintegrating tablet 4mg  ODT q4 hours prn nausea/vomit 10 tablet 0   oxyCODONE -acetaminophen  (PERCOCET/ROXICET) 5-325 MG tablet Pain that is not  relieved by Tylenol  or Motrin  alone 15 tablet 0   tamsulosin  (FLOMAX ) 0.4 MG CAPS capsule Take 1 capsule (0.4 mg total) by mouth daily. 10 capsule 0   valACYclovir  (VALTREX ) 1000 MG tablet TAKE 0.5 TABLETS BY MOUTH DAILY. (Patient taking differently: Take 500 mg by mouth daily.) 90 tablet 3   No current facility-administered medications for this visit.    Allergies: Allergies  Allergen Reactions   Penicillins Rash    Past Medical History:  Diagnosis Date   Depression    GERD (gastroesophageal reflux disease)    HSV-2 infection    Hypothyroidism    Past Surgical History:  Procedure Laterality Date   CESAREAN SECTION N/A 09/07/2021   Procedure: CESAREAN SECTION;  Surgeon: Abigail Abler, MD;  Location: [facility];  Service: Obstetrics;  Laterality: N/A;   TONSILLECTOMY Bilateral 11/24/2019   Procedure: TONSILLECTOMY;  Surgeon: Reynold Caves, MD;  Location: [facility];  Service: ENT;  Laterality: Bilateral;   WISDOM TOOTH EXTRACTION     Family History  Problem Relation Age of Onset   Lung cancer Maternal Grandmother    Social History   Socioeconomic History   Marital status: Media planner    Spouse name: Not on file   Number of children: Not on file   Years of education: Not on file   Highest education level: Not on file  Occupational History   Not on file  Tobacco Use   Smoking status: Former    Current packs/day: 0.25    Types: Cigarettes   Smokeless tobacco: Never  Vaping Use  Vaping status: Every Day  Substance and Sexual Activity   Alcohol use: Not Currently    Comment: hardly ever   Drug use: Not Currently    Types: Marijuana   Sexual activity: Yes    Birth control/protection: Injection  Other Topics Concern   Not on file  Social History Narrative   Not on file   Social Drivers of Health   Financial Resource Strain: Low Risk  (12/06/2021)   Overall Financial Resource Strain (CARDIA)    Difficulty of Paying Living Expenses: Not very hard   Food Insecurity: No Food Insecurity (12/06/2021)   Hunger Vital Sign    Worried About Running Out of Food in the Last Year: Never true    Ran Out of Food in the Last Year: Never true  Transportation Needs: No Transportation Needs (12/06/2021)   PRAPARE - Administrator, Civil Service (Medical): No    Lack of Transportation (Non-Medical): No  Physical Activity: Insufficiently Active (12/06/2021)   Exercise Vital Sign    Days of Exercise per Week: 4 days    Minutes of Exercise per Session: 30 min  Stress: No Stress Concern Present (12/06/2021)   [employer] of Occupational Health - Occupational Stress Questionnaire    Feeling of Stress : Not at all  Social Connections: Moderately Integrated (12/06/2021)   Social Connection and Isolation Panel [NHANES]    Frequency of Communication with Friends and Family: More than three times a week    Frequency of Social Gatherings with Friends and Family: Twice a week    Attends Religious Services: 1 to 4 times per year    Active Member of [employer] or Organizations: No    Attends Banker Meetings: Never    Marital Status: Living with partner  Intimate Partner Violence: Not At Risk (12/06/2021)   Humiliation, Afraid, Rape, and Kick questionnaire    Fear of Current or Ex-Partner: No    Emotionally Abused: No    Physically Abused: No    Sexually Abused: No    SUBJECTIVE  Review of Systems Constitutional: Patient denies any unintentional weight loss or change in strength lntegumentary: Patient denies any rashes or pruritus Cardiovascular: Patient denies chest pain or syncope Respiratory: Patient denies shortness of breath Gastrointestinal: ***Patient denies nausea, vomiting, constipation, or diarrhea ***As per HPI Musculoskeletal: Patient denies muscle cramps or weakness Neurologic: Patient denies convulsions or seizures Allergic/Immunologic: Patient denies recent allergic reaction(s) Hematologic/Lymphatic: Patient  denies bleeding tendencies Endocrine: Patient denies heat/cold intolerance  GU: As per HPI.  OBJECTIVE There were no vitals filed for this visit. There is no height or weight on file to calculate BMI.  Physical Examination Constitutional: No obvious distress; patient is non-toxic appearing  Cardiovascular: No visible lower extremity edema.  Respiratory: The patient does not have audible wheezing/stridor; respirations do not appear labored  Gastrointestinal: Abdomen non-distended Musculoskeletal: Normal ROM of UEs  Skin: No obvious rashes/open sores  Neurologic: CN 2-12 grossly intact Psychiatric: Answered questions appropriately with normal affect  Hematologic/Lymphatic/Immunologic: No obvious bruises or sites of spontaneous bleeding  UA: ***negative ***positive for *** leukocytes, *** blood, ***nitrites Urine microscopy: *** WBC/hpf, *** RBC/hpf, *** bacteria ***glucosuria (secondary to ***Jardiance ***Farxiga use) ***otherwise unremarkable  PVR: *** ml  ASSESSMENT No diagnosis found.  ***We reviewed recent imaging results; ***awaiting radiology results, appears to have ***no acute findings per provider interpretation.  ***For stone prevention: Advised adequate hydration and we discussed option to consider low oxalate diet given that calcium  oxalate is the most  common type of stone. Handout provided about stone prevention diet.  ***For recurrent stone formers: We discussed option to proceed with 24 hour urinalysis ([facility]) for metabolic stone evaluation, which may help with targeted recommendations for dietary I medication therapies for stone prevention. Patient elected to ***proceed/ ***hold off.  Will plan to follow up in ***6 months / ***1 year with ***KUB ***RUS for stone surveillance or sooner if needed.  Patient verbalized understanding of and agreement with current plan. All questions were answered.  PLAN Advised the following: Maintain adequate fluid intake  daily. Drink citrus juice (lemon, lime or orange juice) routinely. Low oxalate diet. No follow-ups on file.  No orders of the defined types were placed in this encounter.   It has been explained that the patient is to follow regularly with their PCP in addition to all other providers involved in their care and to follow instructions provided by these respective offices. Patient advised to contact urology clinic if any urologic-pertaining questions, concerns, new symptoms or problems arise in the interim period.  There are no Patient Instructions on file for this visit.  Electronically signed by:  Lauretta Ponto, MSN, FNP-C, CUNP 06/26/2023 5:25 PM

## 2023-06-27 ENCOUNTER — Ambulatory Visit: Payer: Medicaid Other | Admitting: Urology

## 2023-06-27 DIAGNOSIS — N200 Calculus of kidney: Secondary | ICD-10-CM

## 2023-07-11 ENCOUNTER — Ambulatory Visit: Admitting: Plastic Surgery

## 2023-07-11 ENCOUNTER — Encounter: Payer: Self-pay | Admitting: Plastic Surgery

## 2023-07-11 VITALS — BP 127/75 | HR 63 | Ht 66.0 in | Wt 188.2 lb

## 2023-07-11 DIAGNOSIS — L905 Scar conditions and fibrosis of skin: Secondary | ICD-10-CM

## 2023-07-11 NOTE — Progress Notes (Signed)
 Referring Provider Minus Amel, MD [address] [place],  [place] [address]   CC:  Chief Complaint  Patient presents with   Advice Only      Melissa Ayers is an 23 y.o. female.  HPI: Melissa Ayers is a 23 year old female who presents today with complaints of keloids on her back.  She states that these have been there for at least 10 years.  They have been injected once by a dermatologist.  She is not sure how long ago this was.  She has had no other treatment.  She is not sure what the etiology of the lesions was.  She does note that they occasionally itch and she would like to have some type of treatment.  Allergies  Allergen Reactions   Penicillins Rash    Outpatient Encounter Medications as of 07/11/2023  Medication Sig   acetaminophen  (TYLENOL ) 325 MG tablet Take 2 tablets (650 mg total) by mouth every 4 (four) hours as needed (for pain scale < 4  OR  temperature  >/=  100.5 F). (Patient taking differently: Take by mouth as needed (for pain scale < 4  OR  temperature  >/=  100.5 F).)   levothyroxine  (SYNTHROID ) 75 MCG tablet Take 75 mcg by mouth daily.   valACYclovir  (VALTREX ) 1000 MG tablet TAKE 0.5 TABLETS BY MOUTH DAILY. (Patient taking differently: 500 mg.)   [DISCONTINUED] Blood Pressure Monitor MISC For regular home bp monitoring during pregnancy (Patient not taking: Reported on 07/11/2023)   [DISCONTINUED] cefdinir  (OMNICEF ) 300 MG capsule Take 1 capsule (300 mg total) by mouth 2 (two) times daily.   [DISCONTINUED] doxycycline  (VIBRAMYCIN ) 100 MG capsule Take 1 capsule (100 mg total) by mouth 2 (two) times daily.   [DISCONTINUED] ibuprofen  (ADVIL ) 200 MG tablet Take 800 mg by mouth every 6 (six) hours as needed for moderate pain (pain score 4-6).   [DISCONTINUED] ondansetron  (ZOFRAN -ODT) 4 MG disintegrating tablet 4mg  ODT q4 hours prn nausea/vomit   [DISCONTINUED] oxyCODONE -acetaminophen  (PERCOCET/ROXICET) 5-325 MG tablet Pain that is not relieved by Tylenol  or Motrin   alone   [DISCONTINUED] tamsulosin  (FLOMAX ) 0.4 MG CAPS capsule Take 1 capsule (0.4 mg total) by mouth daily.   No facility-administered encounter medications on file as of 07/11/2023.     Past Medical History:  Diagnosis Date   Depression    GERD (gastroesophageal reflux disease)    HSV-2 infection    Hypothyroidism     Past Surgical History:  Procedure Laterality Date   CESAREAN SECTION N/A 09/07/2021   Procedure: CESAREAN SECTION;  Surgeon: Abigail Abler, MD;  Location: [facility];  Service: Obstetrics;  Laterality: N/A;   TONSILLECTOMY Bilateral 11/24/2019   Procedure: TONSILLECTOMY;  Surgeon: Reynold Caves, MD;  Location: [facility];  Service: ENT;  Laterality: Bilateral;   WISDOM TOOTH EXTRACTION      Family History  Problem Relation Age of Onset   Lung cancer Maternal Grandmother     Social History   Social History Narrative   Not on file     Review of Systems General: Denies fevers, chills, weight loss CV: Denies chest pain, shortness of breath, palpitations Back: Scars which been present for 10 years  Physical Exam    07/11/2023    9:29 AM 12/27/2022   10:24 AM 12/21/2022    2:00 AM  Vitals with BMI  Height 5' 6    Weight 188 lbs 3 oz    BMI 30.39    Systolic 127 128 604  Diastolic  75 80 64  Pulse 63 93 115    General:  No acute distress,  Alert and oriented, Non-Toxic, Normal speech and affect Back: Patient has 4 scars over the back on the upper portion of the back on the posterior deltoids.  These are not keloids they are flattened hypertrophic scars. Mammogram: Not applicable Assessment/Plan Scars: Patient has 4 scars on her back which hypertrophic but flattened.  We discussed steroid injections to these.  She is is currently scheduled to go to the beach for 4 days and is concerned that if she gets a steroid injection that it may affect the outcome if she is exposed to sun this is not entirely unreasonable concern.  I have told her that it  we will schedule a time for the steroid injections once she returns.  I do not recommend excision as the scars are completely flattened and given the area if they are excised it is very likely that she will end up with another hide atrophic scar.  Return for steroid injections after  her trip to the beach.  Teretha Ferguson 07/11/2023, 9:45 AM

## 2023-07-25 ENCOUNTER — Ambulatory Visit: Admitting: Plastic Surgery

## 2023-07-25 DIAGNOSIS — L905 Scar conditions and fibrosis of skin: Secondary | ICD-10-CM

## 2023-07-25 DIAGNOSIS — L910 Hypertrophic scar: Secondary | ICD-10-CM | POA: Diagnosis not present

## 2023-07-25 NOTE — Progress Notes (Signed)
 Melissa Ayers returns today for injection of her hypertrophic scars.  After obtaining consent the scars 1 on the left shoulder and 1 on the right shoulder were injected with Kenalog  Procedure: After preparing the skin the hypertrophic scars were injected with approximately half a milliliter of each of a 50-50 mixture of Kenalog 40 and 1% plain lidocaine .  Melissa Ayers was instructed on scar massage.  Bandages were applied.  She will follow-up with me in 4 to 6 weeks.

## 2023-08-29 ENCOUNTER — Ambulatory Visit: Admitting: Plastic Surgery

## 2023-09-12 ENCOUNTER — Ambulatory Visit: Admitting: Plastic Surgery

## 2023-09-12 ENCOUNTER — Encounter: Payer: Self-pay | Admitting: Plastic Surgery

## 2023-09-12 VITALS — BP 111/72 | HR 77 | Ht 66.0 in | Wt 174.6 lb

## 2023-09-12 DIAGNOSIS — L905 Scar conditions and fibrosis of skin: Secondary | ICD-10-CM

## 2023-09-12 NOTE — Progress Notes (Signed)
 Ms. Colton returns today for evaluation of the scars on her bilateral posterior shoulders.  She has had significant improvement in both sides however there are still raised areas on the scars on the posterior left shoulder.  Procedure: Injection of the posterior shoulder scars with a 50-50 mixture of Kenalog 40 and 1% plain lidocaine .  After obtaining verbal consent the scars were cleaned with alcohol and approximately 0.5 mL of a 50-50 mixture of Kenalog and 1% plain lidocaine  was injected into each scar.  Bandages were applied and the patient was given instructions on scar massage.  She will follow-up in 4 to 6 weeks.

## 2023-10-10 ENCOUNTER — Ambulatory Visit: Admitting: Plastic Surgery

## 2023-10-24 ENCOUNTER — Ambulatory Visit (INDEPENDENT_AMBULATORY_CARE_PROVIDER_SITE_OTHER): Admitting: Plastic Surgery

## 2023-10-24 VITALS — BP 102/64 | HR 86

## 2023-10-24 DIAGNOSIS — L905 Scar conditions and fibrosis of skin: Secondary | ICD-10-CM

## 2023-10-24 NOTE — Progress Notes (Signed)
 Ms. Ishii returns today for evaluation of the hypertrophic scars on her bilateral shoulders.  I have injected them all with Kenalog in the past.  Today she notes that all but one of the small scars are completely flat.  She is happy with this outcome.  On evaluation there is a small scar on the right lateral shoulder which is still raised.  After discussion we agreed to reinject the scar to see if we can get it to flatten any more.  Procedure  After obtaining verbal consent the skin over the scar was prepped with alcohol and injected with 0.4 mL of a 50-50 mixture of Kenalog 40 and 1% plain lidocaine .  A bandage was applied and wound care instructions were provided.  She will follow-up with me in 4 to 6 weeks.

## 2023-12-05 ENCOUNTER — Ambulatory Visit: Admitting: Plastic Surgery

## 2024-01-19 ENCOUNTER — Other Ambulatory Visit: Payer: Self-pay | Admitting: Advanced Practice Midwife
